# Patient Record
Sex: Male | Born: 1970 | Marital: Married | Smoking: Never smoker
Health system: Southern US, Community
[De-identification: ages and names within clinical notes are randomized; demographics above are authoritative.]

## PROBLEM LIST (undated history)

## (undated) DIAGNOSIS — K219 Gastro-esophageal reflux disease without esophagitis: Secondary | ICD-10-CM

## (undated) HISTORY — DX: Gastro-esophageal reflux disease without esophagitis: K21.9

## (undated) HISTORY — PX: CHOLECYSTECTOMY: SHX55

---

## 2018-01-16 DIAGNOSIS — K080 Exfoliation of teeth due to systemic causes: Secondary | ICD-10-CM | POA: Diagnosis not present

## 2018-01-30 DIAGNOSIS — H40033 Anatomical narrow angle, bilateral: Secondary | ICD-10-CM | POA: Diagnosis not present

## 2018-01-30 DIAGNOSIS — H04123 Dry eye syndrome of bilateral lacrimal glands: Secondary | ICD-10-CM | POA: Diagnosis not present

## 2018-02-06 DIAGNOSIS — H10013 Acute follicular conjunctivitis, bilateral: Secondary | ICD-10-CM | POA: Diagnosis not present

## 2018-06-22 DIAGNOSIS — J019 Acute sinusitis, unspecified: Secondary | ICD-10-CM | POA: Diagnosis not present

## 2018-06-22 DIAGNOSIS — H1033 Unspecified acute conjunctivitis, bilateral: Secondary | ICD-10-CM | POA: Diagnosis not present

## 2018-10-09 DIAGNOSIS — K080 Exfoliation of teeth due to systemic causes: Secondary | ICD-10-CM | POA: Diagnosis not present

## 2019-01-15 DIAGNOSIS — R062 Wheezing: Secondary | ICD-10-CM | POA: Diagnosis not present

## 2019-01-15 DIAGNOSIS — J209 Acute bronchitis, unspecified: Secondary | ICD-10-CM | POA: Diagnosis not present

## 2019-01-15 DIAGNOSIS — R05 Cough: Secondary | ICD-10-CM | POA: Diagnosis not present

## 2019-02-09 DIAGNOSIS — K080 Exfoliation of teeth due to systemic causes: Secondary | ICD-10-CM | POA: Diagnosis not present

## 2019-02-12 DIAGNOSIS — K080 Exfoliation of teeth due to systemic causes: Secondary | ICD-10-CM | POA: Diagnosis not present

## 2019-05-26 DIAGNOSIS — U071 COVID-19: Secondary | ICD-10-CM | POA: Diagnosis not present

## 2019-05-26 DIAGNOSIS — Z20828 Contact with and (suspected) exposure to other viral communicable diseases: Secondary | ICD-10-CM | POA: Diagnosis not present

## 2019-07-04 DIAGNOSIS — J3489 Other specified disorders of nose and nasal sinuses: Secondary | ICD-10-CM | POA: Diagnosis not present

## 2019-07-04 DIAGNOSIS — R0789 Other chest pain: Secondary | ICD-10-CM | POA: Diagnosis not present

## 2019-07-07 ENCOUNTER — Telehealth: Payer: Self-pay | Admitting: *Deleted

## 2019-07-07 NOTE — Telephone Encounter (Signed)
REFERRAL SENT TO SCHEDULING AND NOTES ON FILE FROM [facility]

## 2019-07-09 ENCOUNTER — Emergency Department (HOSPITAL_COMMUNITY): Payer: Federal, State, Local not specified - PPO

## 2019-07-09 ENCOUNTER — Emergency Department (HOSPITAL_COMMUNITY)
Admission: EM | Admit: 2019-07-09 | Discharge: 2019-07-09 | Disposition: A | Discharge status: Home / Self Care | Payer: Federal, State, Local not specified - PPO | Attending: Emergency Medicine | Admitting: Emergency Medicine

## 2019-07-09 ENCOUNTER — Other Ambulatory Visit: Payer: Self-pay

## 2019-07-09 DIAGNOSIS — R079 Chest pain, unspecified: Secondary | ICD-10-CM | POA: Insufficient documentation

## 2019-07-09 LAB — URINALYSIS, ROUTINE W REFLEX MICROSCOPIC
Bilirubin Urine: NEGATIVE
Glucose, UA: NEGATIVE mg/dL
Hgb urine dipstick: NEGATIVE
Ketones, ur: NEGATIVE mg/dL
Leukocytes,Ua: NEGATIVE
Nitrite: NEGATIVE
Protein, ur: NEGATIVE mg/dL
Specific Gravity, Urine: 1.026 (ref 1.005–1.030)
pH: 5 (ref 5.0–8.0)

## 2019-07-09 LAB — CBC
HCT: 51.4 % (ref 39.0–52.0)
Hemoglobin: 18 g/dL — ABNORMAL HIGH (ref 13.0–17.0)
MCH: 30.7 pg (ref 26.0–34.0)
MCHC: 35 g/dL (ref 30.0–36.0)
MCV: 87.6 fL (ref 80.0–100.0)
Platelets: 175 10*3/uL (ref 150–400)
RBC: 5.87 MIL/uL — ABNORMAL HIGH (ref 4.22–5.81)
RDW: 11.8 % (ref 11.5–15.5)
WBC: 7.3 10*3/uL (ref 4.0–10.5)
nRBC: 0 % (ref 0.0–0.2)

## 2019-07-09 LAB — BASIC METABOLIC PANEL
Anion gap: 11 (ref 5–15)
BUN: 14 mg/dL (ref 6–20)
CO2: 25 mmol/L (ref 22–32)
Calcium: 9.9 mg/dL (ref 8.9–10.3)
Chloride: 102 mmol/L (ref 98–111)
Creatinine, Ser: 0.96 mg/dL (ref 0.61–1.24)
GFR calc Af Amer: >60 mL/min (ref >60)
GFR calc non Af Amer: >60 mL/min (ref >60)
Glucose, Bld: 120 mg/dL — ABNORMAL HIGH (ref 70–99)
Potassium: 4 mmol/L (ref 3.5–5.1)
Sodium: 138 mmol/L (ref 135–145)

## 2019-07-09 LAB — TROPONIN I (HIGH SENSITIVITY)
Troponin I (High Sensitivity): <2 ng/L (ref <18)
Troponin I (High Sensitivity): <2 ng/L (ref <18)

## 2019-07-09 MED ORDER — SODIUM CHLORIDE 0.9% FLUSH
3.0000 mL | Freq: Once | INTRAVENOUS | Status: AC
  Administered 2019-07-09: 3 mL via INTRAVENOUS

## 2019-07-09 NOTE — ED Provider Notes (Signed)
[facility] EMERGENCY DEPARTMENT Provider Note   CSN: [id number] Arrival date & time: 07/09/19  1217    History   Chief Complaint Chief Complaint  Patient presents with   Chest Pain    HPI Anthony Thompson is a 48 y.o. male who presents to the ED today complaining of gradual onset, constant, achy, left sided chest pain x 2 weeks. Pt also complains of bilateral lower abdominal pain and flank pain x 2 weeks as well. He reports he was seen by Urgent Care last week and this week; they did a chest xray and an EKG both times and sent him home. Pt reports he decided to come to the ED today after being seen at urgent care this morning because he wants to know what is wrong. Pt reports he attempted to reach out to a cardiologist but they can only see him on 07/23. Pt has had chest pain like this in the past but states he has never been worked up for this. He did recently travel to [place] but reports his chest pain was present prior to his trip down. The chest pain is worsened with laying flat. Denies fever, chills, shortness of breath, nausea, vomiting, urinary symptoms, or any other associated symptoms.     No past medical history on file.  There are no active problems to display for this patient.       Home Medications    Prior to Admission medications   Not on File    Family History No family history on file.  Social History Social History   Tobacco Use   Smoking status: Not on file  Substance Use Topics   Alcohol use: Not on file   Drug use: Not on file     Allergies   Patient has no known allergies.   Review of Systems Review of Systems  Constitutional: Negative for chills and fever.  HENT: Negative for congestion.   Eyes: Negative for visual disturbance.  Respiratory: Negative for cough and shortness of breath.   Cardiovascular: Positive for chest pain. Negative for palpitations and leg swelling.  Gastrointestinal: Positive for abdominal  pain. Negative for constipation, diarrhea, rectal pain and vomiting.  Genitourinary: Negative for flank pain.  Musculoskeletal: Negative for myalgias.  Skin: Negative for rash.  Neurological: Negative for headaches.     Physical Exam Updated Vital Signs BP (!) 149/100 (BP Location: Left Arm)    Pulse 84    Temp 98.7 F (37.1 C) (Oral)    Resp 16    Ht 5\' 11"  (1.803 m)    Wt 104.3 kg    SpO2 100%    BMI 32.08 kg/m   Physical Exam Vitals signs and nursing note reviewed.  Constitutional:      Appearance: He is not ill-appearing.  HENT:     Head: Normocephalic and atraumatic.  Eyes:     Conjunctiva/sclera: Conjunctivae normal.  Neck:     Musculoskeletal: Neck supple.  Cardiovascular:     Rate and Rhythm: Normal rate and regular rhythm.     Pulses:          Radial pulses are 2+ on the right side and 2+ on the left side.       Dorsalis pedis pulses are 2+ on the right side and 2+ on the left side.     Heart sounds: Normal heart sounds.  Pulmonary:     Effort: Pulmonary effort is normal.     Breath sounds: Normal breath sounds.  No decreased breath sounds, wheezing, rhonchi or rales.  Chest:     Chest wall: No tenderness.  Abdominal:     Palpations: Abdomen is soft.     Tenderness: There is no abdominal tenderness. There is no guarding or rebound.     Comments: Soft, NTND, +BS throughout, no r/g/r, neg murphy's, neg mcburney's, no CVA TTP   Skin:    General: Skin is warm and dry.  Neurological:     Mental Status: He is alert.      ED Treatments / Results  Labs (all labs ordered are listed, but only abnormal results are displayed) Labs Reviewed  BASIC METABOLIC PANEL - Abnormal; Notable for the following components:      Result Value   Glucose, Bld 120 (*)    All other components within normal limits  CBC - Abnormal; Notable for the following components:   RBC 5.87 (*)    Hemoglobin 18.0 (*)    All other components within normal limits  URINALYSIS, ROUTINE W REFLEX  MICROSCOPIC  TROPONIN I (HIGH SENSITIVITY)  TROPONIN I (HIGH SENSITIVITY)    EKG EKG Interpretation  Date/Time:  Friday July 09 2019 12:13:03 EDT Ventricular Rate:  77 PR Interval:  150 QRS Duration: 82 QT Interval:  380 QTC Calculation: 430 R Axis:   75 Text Interpretation:  Normal sinus rhythm Cannot rule out Inferior infarct , age undetermined Abnormal ECG No old tracing to compare Confirmed by Daleen Bo [phone]) on 07/09/2019 12:41:47 PM   Radiology Dg Chest 2 View  Result Date: 07/09/2019 CLINICAL DATA:  Chest pain EXAM: CHEST - 2 VIEW COMPARISON:  None. FINDINGS: Lungs are clear. Heart size and pulmonary vascularity are normal. No adenopathy. No pneumothorax. No bone lesions. IMPRESSION: No edema or consolidation. Electronically Signed   By: Lowella Grip III M.D.   On: 07/09/2019 12:53    Procedures Procedures (including critical care time)  Medications Ordered in ED Medications  sodium chloride flush (NS) 0.9 % injection 3 mL (3 mLs Intravenous Given 07/09/19 1232)     Initial Impression / Assessment and Plan / ED Course  I have reviewed the triage vital signs and the nursing notes.  Pertinent labs & imaging results that were available during my care of the patient were reviewed by me and considered in my medical decision making (see chart for details).    48 year old male presenting with chest pain, bilateral lower abd pain, and flank pain x 2 weeks. Pt has been seen by UC twice in the past week without any findings of CXR or EKG. He came here today to get answers. No other concerning symptoms regarding chest pain including SOB, radiation of pain, nausea, vomiting. The pain has been present consistently for the past 2 weeks. No hx of MI in the past and no FHx CAD. Pt is nonsmoker. He did recently travel to [place] but is adamant that his pain started prior to his trip down; no other recent prolonged travel or immobilization prior to the pain beginning. No recent  URI like symptoms to suggest pericarditis vs myocarditis. Very low suspicion for ACS today given length of symptoms. Regarding abdominal pain - pt is non tender to CVAs bilaterally as well as abdomen on exam. No nausea, vomiting, urinary sx. Low suspicion for kidney stones today but will obtain U/A; do not feel patient needs imaging at this time to rule out kidney stones vs acute abdomen as he has no tenderness. Pt afebrile in the ED without  tachycardia or tachypnea; does not meet sirs criteria. Labwork obtained prior to being seen; EKG with age indeterminate inferior infarct. CXR negative. Initial trop negative; will obtain repeat trop given EKG findings although do not suspect elevation at this time. Remainder of bloodwork reassuring. Will reevaluate once U/A and repeat trop returns.   U/A clear; no signs of infection or blood to suggest kidney stones. Repeat trop negative; heart score 2. Will discharge patient home at this time with close PCP follow up; he reports that he has a telemedicine appt with cardiology on 07/23; advised to keep appointment/call them to see if they can see him sooner given he was in the ED. He is in agreement with plan at this time and stable for discharge home.   HEAR Score: 2    Final Clinical Impressions(s) / ED Diagnoses   Final diagnoses:  Nonspecific chest pain    ED Discharge Orders    None       Eustaquio Maize, Hershal Coria 07/09/19 2137    Daleen Bo, MD 07/12/19 1428

## 2019-07-09 NOTE — Discharge Instructions (Signed)
You were seen in the ED today for chest pain; your labwork and chest xray were reassuring today. Please follow up with your PCP. Please keep appointment with cardiology scheduled for later this month. Return to the ED for any worsening chest pain, shortness of breath, vomiting.

## 2019-07-09 NOTE — ED Triage Notes (Signed)
To ED for eval of 2 wks of left side cp. States he feels a bubble around his heart. Also complains of bilateral lower abd pain and flank pain. Denies urinary symptoms. Pain and discomfort worsens when lays down. Seen at a [facility] prior to ED. No meds and no hx

## 2019-07-21 ENCOUNTER — Telehealth: Payer: Self-pay | Admitting: Cardiology

## 2019-07-21 NOTE — Progress Notes (Deleted)
Cardiology Office Note   Date:  07/21/2019   ID:  [name], DOB 23-Apr-1971, MRN [id number]  PCP:  Patient, No Pcp Per  Cardiologist:   No primary care provider on file. Referring:  ***   History of Present Illness: Anthony Thompson is a 48 y.o. male who is referred by *** for evaluation of chest pain.   The patient was n the ED for this earlier this month.  I reviewed these records for this visit.    He had negative hs Trop x 2 and a HEAR score of 2.  ***      No past medical history on file.  *** The histories are not reviewed yet. Please review them in the "History" navigator section and refresh this [place].   No current outpatient medications on file.   No current facility-administered medications for this visit.     Allergies:   Patient has no known allergies.    Social History:  The patient     Family History:  The patient's ***family history is not on file.    ROS:  Please see the history of present illness.   Otherwise, review of systems are positive for {NONE DEFAULTED:18576::"none"}.   All other systems are reviewed and negative.    PHYSICAL EXAM: VS:  There were no vitals taken for this visit. , BMI There is no height or weight on file to calculate BMI. GENERAL:  Well appearing HEENT:  Pupils equal round and reactive, fundi not visualized, oral mucosa unremarkable NECK:  No jugular venous distention, waveform within normal limits, carotid upstroke brisk and symmetric, no bruits, no thyromegaly LYMPHATICS:  No cervical, inguinal adenopathy LUNGS:  Clear to auscultation bilaterally BACK:  No CVA tenderness CHEST:  Unremarkable HEART:  PMI not displaced or sustained,S1 and S2 within normal limits, no S3, no S4, no clicks, no rubs, *** murmurs ABD:  Flat, positive bowel sounds normal in frequency in pitch, no bruits, no rebound, no guarding, no midline pulsatile mass, no hepatomegaly, no splenomegaly EXT:  2 plus pulses throughout, no edema, no  cyanosis no clubbing SKIN:  No rashes no nodules NEURO:  Cranial nerves II through XII grossly intact, motor grossly intact throughout PSYCH:  Cognitively intact, oriented to person place and time    EKG:  EKG {ACTION; IS/IS HYQ:[id number]} ordered today. The ekg ordered 07/09/19 demonstrates NSR rate 71, axis WNL, intervals WNL, no acute ST wave changes.  Nonspecific inferior T waves.     Recent Labs: 07/09/2019: BUN 14; Creatinine, Ser 0.96; Hemoglobin 18.0; Platelets 175; Potassium 4.0; Sodium 138    Lipid Panel No results found for: CHOL, TRIG, HDL, CHOLHDL, VLDL, LDLCALC, LDLDIRECT    Wt Readings from Last 3 Encounters:  07/09/19 230 lb (104.3 kg)      Other studies Reviewed: Additional studies/ records that were reviewed today include: ***. Review of the above records demonstrates:  Please see elsewhere in the note.  ***   ASSESSMENT AND PLAN:  CHEST PAIN:  ***   Current medicines are reviewed at length with the patient today.  The patient {ACTIONS; HAS/DOES NOT HAVE:19233} concerns regarding medicines.  The following changes have been made:  {PLAN; NO CHANGE:13088:s}  Labs/ tests ordered today include: *** No orders of the defined types were placed in this encounter.    Disposition:   FU with ***    Signed, Minus Breeding, MD  07/21/2019 8:00 AM    [facility]

## 2019-07-21 NOTE — Telephone Encounter (Signed)
IUnable to contact patient, phone was busy.

## 2019-07-22 ENCOUNTER — Ambulatory Visit: Payer: Self-pay | Admitting: Cardiology

## 2019-07-22 NOTE — Telephone Encounter (Signed)
Called pt(pt did not show up for his appt scheduled this morning @940am ) per Dr Percival Spanish direction, he states that we had the wrong number so he had the operator that answered the phone to reschedule him change his number. He was telling me that most mornings he is waking with chest pain/pressure. He states that he has noticed that this is on the days that he DOES NOT exercise. He was wondering if he needs to be seen sooner than his scheduled appt date 08-12-2019. D/w Dr Percival Spanish offered him to come in at the end of the day to be evaluated and he states that he cannot make it to the office today to be evaluated. Per Dr Percival Spanish schedule ASAP with APP. Pt states that he cannot come in until after 08-02-2019. I have scheduled him to see Jory Sims, DNP on 08-02-2019.

## 2019-07-22 NOTE — Telephone Encounter (Signed)
CONTINUED(got kicked out of chart): Pt will go to ER if this happens again, pt does not have an rx for nitro. Verbalizes understanding.

## 2019-07-22 NOTE — Telephone Encounter (Signed)
PT NO-SHOWED FOR HIS APPT TODAY AT 940 TRIED X5

## 2019-08-01 NOTE — Progress Notes (Signed)
Cardiology Office Note   Date:  08/02/2019   ID:  Anthony Thompson, DOB [birth year], MRN [id number]  PCP:  Patient, No Pcp Per  Cardiologist:  NEW  New Patient to be established with Dr.Christopher    History of Present Illness: Anthony Thompson is a 48 y.o. male who presents for evaluation of recurrent chest pain, as a new patient.  He had been seen in the emergency room on 07/09/2019 with complaints of chest pain bilateral lower abdominal pain and flank pain for 2 weeks.  He has not been seen by cardiology but was planned to see Dr. Percival Spanish on 07/22/2019 but was unable to come to the appointment.  During ER evaluation on 07/09/2019, EKG revealed normal sinus rhythm question old inferior infarct age undetermined heart rate was 77 bpm.  Per ER note he has no family history of coronary artery disease, he was a non-smoker.  He was not found to have upper respiratory infection, or symptoms to suggest pericarditis or myocarditis.  He was ruled out for ACS.  He was to follow-up with cardiology.   He comes today to be established in our practice with 6 months of chest discomfort, described as sharp, heaviness, radiating down the left arm, sometimes awaking him from sleep. He states that he is tired of the pain.   History reviewed. No pertinent past medical history.   Current Outpatient Medications  Medication Sig Dispense Refill  . pantoprazole (PROTONIX) 40 MG tablet Take 1 tablet (40 mg total) by mouth daily. 30 tablet 6   No current facility-administered medications for this visit.     Allergies:   Patient has no known allergies.    Social History:  The patient  reports that he has never smoked. He has never used smokeless tobacco. He reports current alcohol use.   Family History:  The patient's family history includes Dementia in his mother; Hyperlipidemia in his father.    ROS: All other systems are reviewed and negative. Unless otherwise mentioned in H&P    PHYSICAL EXAM: VS:  BP 118/70    Pulse 66   Ht '5\' 11"'  (1.803 m)   Wt 232 lb (105.2 kg)   BMI 32.36 kg/m  , BMI Body mass index is 32.36 kg/m. GEN: Well nourished, well developed, in no acute distress HEENT: normal Neck: no JVD, carotid bruits, or masses Cardiac: RRR; no murmurs, rubs, or gallops,no edema  Respiratory:  Clear to auscultation bilaterally, normal work of breathing GI: soft, nontender, nondistended, + BS MS: no deformity or atrophy Skin: warm and dry, no rash Neuro:  Strength and sensation are intact Psych: euthymic mood, full affect   EKG: Normal sinus rhythm, rate of 66 bpm, no abnormalities noted on EKG.  Recent Labs: 07/09/2019: BUN 14; Creatinine, Ser 0.96; Hemoglobin 18.0; Platelets 175; Potassium 4.0; Sodium 138    Lipid Panel No results found for: CHOL, TRIG, HDL, CHOLHDL, VLDL, LDLCALC, LDLDIRECT    Wt Readings from Last 3 Encounters:  08/02/19 232 lb (105.2 kg)  07/09/19 230 lb (104.3 kg)      Other studies Reviewed: None.  ASSESSMENT AND PLAN:  1.  Chest discomfort: Uncertain etiology at this time.  The patient does not have significant cardiovascular risk factors but is having some chest pressure, worse when lying down often awakening him at night, and some sharp discomfort in the middle of his chest.  The pain is not reproducible on palpation.  The patient is able to do ADLs, mow his lawn without  pressure, but when he rests or lies down he has symptoms.  Chest pain appears atypical at this time.  However, in order to be thorough we will plan a nuclear medicine stress test in which she will also walk on the treadmill.  This is been reviewed by Dr. Harrell Gave who has met the patient and would like to have him walk on the treadmill to evaluate for his heart rate blood pressure and chronotropic incompetence as well as evaluation for ischemia. Checking BMET and Fasting lipids and LFT's   2.  R/O GERD: I have started him on Protonix 40 mg daily.   He is aware that this may take a  couple of days to take effect if his symptoms are related to GERD.   Current medicines are reviewed at length with the patient today.    Labs/ tests ordered today include: NM Stress test , fasting lipids and LFTs for risk stratification.   Phill Myron. West Pugh, ANP, AACC   08/02/2019 5:07 PM    [place] [address] Office [phone] Fax [phone]

## 2019-08-02 ENCOUNTER — Encounter: Payer: Self-pay | Admitting: Adult Health

## 2019-08-02 ENCOUNTER — Ambulatory Visit (INDEPENDENT_AMBULATORY_CARE_PROVIDER_SITE_OTHER): Payer: Federal, State, Local not specified - PPO | Admitting: Adult Health

## 2019-08-02 ENCOUNTER — Other Ambulatory Visit: Payer: Self-pay

## 2019-08-02 VITALS — BP 118/70 | HR 66 | Ht 71.0 in | Wt 232.0 lb

## 2019-08-02 DIAGNOSIS — Z79899 Other long term (current) drug therapy: Secondary | ICD-10-CM | POA: Diagnosis not present

## 2019-08-02 DIAGNOSIS — Z13220 Encounter for screening for lipoid disorders: Secondary | ICD-10-CM | POA: Diagnosis not present

## 2019-08-02 DIAGNOSIS — R0789 Other chest pain: Secondary | ICD-10-CM | POA: Diagnosis not present

## 2019-08-02 MED ORDER — PANTOPRAZOLE SODIUM 40 MG PO TBEC: 40.0000 mg | DELAYED_RELEASE_TABLET | Freq: Every day | ORAL | 6 refills | Status: DC

## 2019-08-02 NOTE — Patient Instructions (Addendum)
Medication Instructions:  START- Protonix 40 mg by mouth daily  If you need a refill on your cardiac medications before your next appointment, please call your pharmacy.  Labwork: Fasting Lipid, Liver and BMP HERE IN OUR OFFICE AT [facility]  You will need to fast. DO NOT EAT OR DRINK PAST MIDNIGHT.    Take the provided lab slips with you to the lab for your blood draw.   When you have your labs (blood work) drawn today and your tests are completely normal, you will receive your results only by MyChart Message (if you have MyChart) -OR-  A paper copy in the mail.  If you have any lab test that is abnormal or we need to change your treatment, we will call you to review these results.  Testing/Procedures: Your physician has requested that you have an exercise tolerance test. For further information please visit [email or web address]. Please also follow instruction sheet, as given.  Follow-Up: . Your physician recommends that you schedule a follow-up appointment in: 3 Weeks with Jory Sims   At [facility], you and your health needs are our priority.  As part of our continuing mission to provide you with exceptional heart care, we have created designated Provider Care Teams.  These Care Teams include your primary Cardiologist (physician) and Advanced Practice Providers (APPs -  Physician Assistants and Nurse Practitioners) who all work together to provide you with the care you need, when you need it.  Thank you for choosing [facility] at [facility]!!    Patient Instructions This is a Drive Up Visit at the [employer] [address]. Someone will direct you to the appropriate testing line. Stay in your car and someone will be with you shortly.

## 2019-08-05 NOTE — Addendum Note (Signed)
Addended by: Crissie Reese on: 08/05/2019 04:46 PM   Modules accepted: Orders

## 2019-08-09 DIAGNOSIS — R079 Chest pain, unspecified: Secondary | ICD-10-CM | POA: Diagnosis not present

## 2019-08-09 DIAGNOSIS — Z0000 Encounter for general adult medical examination without abnormal findings: Secondary | ICD-10-CM | POA: Diagnosis not present

## 2019-08-09 DIAGNOSIS — Z13220 Encounter for screening for lipoid disorders: Secondary | ICD-10-CM | POA: Diagnosis not present

## 2019-08-09 DIAGNOSIS — R7309 Other abnormal glucose: Secondary | ICD-10-CM | POA: Diagnosis not present

## 2019-08-09 DIAGNOSIS — D582 Other hemoglobinopathies: Secondary | ICD-10-CM | POA: Diagnosis not present

## 2019-08-10 ENCOUNTER — Other Ambulatory Visit (HOSPITAL_COMMUNITY)
Admission: RE | Admit: 2019-08-10 | Discharge: 2019-08-10 | Disposition: A | Discharge status: Home / Self Care | Payer: Federal, State, Local not specified - PPO | Source: Ambulatory Visit | Attending: Adult Health | Admitting: Adult Health

## 2019-08-10 ENCOUNTER — Telehealth (HOSPITAL_COMMUNITY): Payer: Self-pay

## 2019-08-10 DIAGNOSIS — Z20828 Contact with and (suspected) exposure to other viral communicable diseases: Secondary | ICD-10-CM | POA: Diagnosis not present

## 2019-08-10 DIAGNOSIS — Z01812 Encounter for preprocedural laboratory examination: Secondary | ICD-10-CM | POA: Insufficient documentation

## 2019-08-10 LAB — SARS CORONAVIRUS 2 (TAT 6-24 HRS): SARS Coronavirus 2: NEGATIVE

## 2019-08-10 NOTE — Telephone Encounter (Signed)
Encounter complete. 

## 2019-08-12 ENCOUNTER — Telehealth: Payer: Self-pay | Admitting: Cardiology

## 2019-08-13 ENCOUNTER — Ambulatory Visit (HOSPITAL_COMMUNITY)
Admission: RE | Admit: 2019-08-13 | Discharge: 2019-08-13 | Disposition: A | Discharge status: Home / Self Care | Payer: Federal, State, Local not specified - PPO | Source: Ambulatory Visit | Attending: Cardiology | Admitting: Cardiology

## 2019-08-13 ENCOUNTER — Other Ambulatory Visit: Payer: Self-pay

## 2019-08-13 DIAGNOSIS — R0789 Other chest pain: Secondary | ICD-10-CM

## 2019-08-13 DIAGNOSIS — Z79899 Other long term (current) drug therapy: Secondary | ICD-10-CM | POA: Diagnosis not present

## 2019-08-13 DIAGNOSIS — Z13220 Encounter for screening for lipoid disorders: Secondary | ICD-10-CM | POA: Diagnosis not present

## 2019-08-13 LAB — EXERCISE TOLERANCE TEST
Estimated workload: 16.3 METS
Exercise duration (min): 13 min
Exercise duration (sec): 34 s
MPHR: 174 {beats}/min
Peak HR: 169 {beats}/min
Percent HR: 97 %
Rest HR: 64 {beats}/min

## 2019-08-13 LAB — BASIC METABOLIC PANEL
BUN/Creatinine Ratio: 18 (ref 9–20)
BUN: 16 mg/dL (ref 6–24)
CO2: 24 mmol/L (ref 20–29)
Calcium: 9.6 mg/dL (ref 8.7–10.2)
Chloride: 100 mmol/L (ref 96–106)
Creatinine, Ser: 0.88 mg/dL (ref 0.76–1.27)
GFR calc Af Amer: 119 mL/min/{1.73_m2} (ref >59)
GFR calc non Af Amer: 103 mL/min/{1.73_m2} (ref >59)
Glucose: 98 mg/dL (ref 65–99)
Potassium: 4.2 mmol/L (ref 3.5–5.2)
Sodium: 139 mmol/L (ref 134–144)

## 2019-08-13 LAB — HEPATIC FUNCTION PANEL
ALT: 40 IU/L (ref 0–44)
AST: 22 IU/L (ref 0–40)
Albumin: 4.7 g/dL (ref 4.0–5.0)
Alkaline Phosphatase: 76 IU/L (ref 39–117)
Bilirubin Total: 0.9 mg/dL (ref 0.0–1.2)
Bilirubin, Direct: 0.22 mg/dL (ref 0.00–0.40)
Total Protein: 6.7 g/dL (ref 6.0–8.5)

## 2019-08-13 LAB — LIPID PANEL
Chol/HDL Ratio: 3.8 ratio (ref 0.0–5.0)
Cholesterol, Total: 183 mg/dL (ref 100–199)
HDL: 48 mg/dL (ref >39)
LDL Calculated: 117 mg/dL — ABNORMAL HIGH (ref 0–99)
Triglycerides: 91 mg/dL (ref 0–149)
VLDL Cholesterol Cal: 18 mg/dL (ref 5–40)

## 2019-08-28 NOTE — Progress Notes (Signed)
Virtual Visit via Video Note   This visit type was conducted due to national recommendations for restrictions regarding the COVID-19 Pandemic (e.g. social distancing) in an effort to limit this patient's exposure and mitigate transmission in our community.  Due to his co-morbid illnesses, this patient is at least at moderate risk for complications without adequate follow up.  This format is felt to be most appropriate for this patient at this time.  All issues noted in this document were discussed and addressed.  A limited physical exam was performed with this format.  Please refer to the patient's chart for his consent to telehealth for [facility].   Date:  08/30/2019   ID:  Anthony Thompson, DOB January 17, 1971, MRN [id number]  Patient Location: Home Provider Location: Home  PCP:  Patient, No Pcp Per  Cardiologist: Dr. Harrell Gave Electrophysiologist:  None   Evaluation Performed:  Follow-Up Visit  Chief Complaint: Chest pain  History of Present Illness:    Anthony Thompson is a 48 y.o. male with we are following for ongoing assessment and management of chest pain.  He had been seen in the emergency room with complaints of chest pain and was to follow-up with cardiology.  He was established with our office on 08/02/2019 as a new patient.  At that time he had complaints of chest discomfort on and off for 6 months described as sharp heaviness radiating down his left arm sometimes awakening him from sleep.  Due to significant cardiovascular risk factors, he was scheduled for a stress test for evaluation of ischemia.  Also a BMET and fasting lipids and LFTs were ordered.  The patient had been seen onsite by Dr. Harrell Gave who agreed with this plan.  Stress test completed on 08/13/2019 revealed normal sinus rhythm, no evidence of ischemia on EKG, blood pressure response was normal.  Review of labs found that LDL was 117, total cholesterol 183 glycerides 91 HDL 48.  Chemistries were normal as well  as hepatic function.   Anthony Thompson continues to have discomfort in his chest located on the left side worrisome to him more so at night when he lays down.  He is frustrated by it and does not understand why it continues to occur even though he has normal stress test  The patient does not have symptoms concerning for COVID-19 infection (fever, chills, cough, or new shortness of breath).    Past Medical History:  Diagnosis Date   GERD (gastroesophageal reflux disease)       Current Meds  Medication Sig   pantoprazole (PROTONIX) 40 MG tablet Take 1 tablet (40 mg total) by mouth daily.     Allergies:   Patient has no known allergies.   Social History   Tobacco Use   Smoking status: Never Smoker   Smokeless tobacco: Never Used  Substance Use Topics   Alcohol use: Yes    Comment: occassionally   Drug use: Not on file     Family Hx: The patient's family history includes Dementia in his mother; Hyperlipidemia in his father.  ROS:   Please see the history of present illness.    All other systems reviewed and are negative.   Prior CV studies:   The following studies were reviewed today:  EXERCISE STRESS TEST 08/13/2019 Normal exercise stress test at excellent work load Normal hemodynamic response No significant arrhythmias  Labs/Other Tests and Data Reviewed:    EKG:  No ECG reviewed.  Recent Labs: 07/09/2019: Hemoglobin 18.0; Platelets 175 08/13/2019:  ALT 40; BUN 16; Creatinine, Ser 0.88; Potassium 4.2; Sodium 139   Recent Lipid Panel Lab Results  Component Value Date/Time   CHOL 183 08/13/2019 09:10 AM   TRIG 91 08/13/2019 09:10 AM   HDL 48 08/13/2019 09:10 AM   CHOLHDL 3.8 08/13/2019 09:10 AM   LDLCALC 117 (H) 08/13/2019 09:10 AM    Wt Readings from Last 3 Encounters:  08/30/19 230 lb (104.3 kg)  08/02/19 232 lb (105.2 kg)  07/09/19 230 lb (104.3 kg)     Objective:    Vital Signs:  Ht 5\' 11"  (1.803 m)    Wt 230 lb (104.3 kg)    BMI 32.08 kg/m     VITAL SIGNS:  reviewed GEN:  no acute distress RESPIRATORY:  normal respiratory effort, symmetric expansion NEURO:  alert and oriented x 3, no obvious focal deficit PSYCH:  normal affect  ASSESSMENT & PLAN:    1.  Recurrent chest pain: Some relief from omeprazole.  Stress test was negative for ischemia.  He does not have any cardiovascular risk factors other than obesity.  I have offered a cardiac CTA for more definitive evaluation of coronary artery disease.  He wishes to hold off on this testing for now.  2.  GERD: Currently on omeprazole 40 mg daily.  I told him that he may take an additional 40 mg at nighttime until he sees his PCP who may need to refer him to GI.  He has found some relief with the omeprazole during the a.m. but at nighttime he noticed his discomfort coming back.  We will see him after he has his GI referral for any further cardiac testing which may be necessary to include the cardiac CTA.  He verbalizes understanding and will be in touch with [place].  Plan follow-up in 2 to 3 months.  COVID-19 Education: The signs and symptoms of COVID-19 were discussed with the patient and how to seek care for testing (follow up with PCP or arrange E-visit).  The importance of social distancing was discussed today.  Time:   Today, I have spent 10 minutes with the patient with telehealth technology discussing the above problems.     Medication Adjustments/Labs and Tests Ordered: Current medicines are reviewed at length with the patient today.  Concerns regarding medicines are outlined above.   Tests Ordered: No orders of the defined types were placed in this encounter.   Medication Changes: No orders of the defined types were placed in this encounter.   Disposition:  Follow up 2 to 3 months  Signed, Phill Myron. West Pugh, ANP, AACC  08/30/2019 1:20 PM    [facility]

## 2019-08-30 ENCOUNTER — Telehealth (INDEPENDENT_AMBULATORY_CARE_PROVIDER_SITE_OTHER): Payer: Federal, State, Local not specified - PPO | Admitting: Adult Health

## 2019-08-30 ENCOUNTER — Encounter: Payer: Self-pay | Admitting: Adult Health

## 2019-08-30 VITALS — Ht 71.0 in | Wt 230.0 lb

## 2019-08-30 DIAGNOSIS — K219 Gastro-esophageal reflux disease without esophagitis: Secondary | ICD-10-CM

## 2019-08-30 DIAGNOSIS — R0789 Other chest pain: Secondary | ICD-10-CM

## 2019-08-30 NOTE — Patient Instructions (Signed)
Medication Instructions:  Continue current medications  If you need a refill on your cardiac medications before your next appointment, please call your pharmacy.  Labwork: None Ordered   Testing/Procedures: None Ordered  Follow-Up: You will need a follow up appointment in 3 months.  Please call our office 2 months in advance to schedule this appointment.  You may see Dr Harrell Gave or one of the following Advanced Practice Providers on your designated Care Team:   Rosaria Ferries, PA-C . Jory Sims, DNP, ANP     At [facility], you and your health needs are our priority.  As part of our continuing mission to provide you with exceptional heart care, we have created designated Provider Care Teams.  These Care Teams include your primary Cardiologist (physician) and Advanced Practice Providers (APPs -  Physician Assistants and Nurse Practitioners) who all work together to provide you with the care you need, when you need it.  Thank you for choosing [facility] at [facility]!!

## 2019-09-01 DIAGNOSIS — Z23 Encounter for immunization: Secondary | ICD-10-CM | POA: Diagnosis not present

## 2019-09-01 DIAGNOSIS — R079 Chest pain, unspecified: Secondary | ICD-10-CM | POA: Diagnosis not present

## 2019-09-02 DIAGNOSIS — R1012 Left upper quadrant pain: Secondary | ICD-10-CM | POA: Diagnosis not present

## 2019-09-02 DIAGNOSIS — R0789 Other chest pain: Secondary | ICD-10-CM | POA: Diagnosis not present

## 2019-09-09 ENCOUNTER — Other Ambulatory Visit: Payer: Self-pay | Admitting: Gastroenterology

## 2019-09-09 DIAGNOSIS — R1012 Left upper quadrant pain: Secondary | ICD-10-CM

## 2019-09-13 DIAGNOSIS — R1012 Left upper quadrant pain: Secondary | ICD-10-CM | POA: Diagnosis not present

## 2019-09-16 DIAGNOSIS — K080 Exfoliation of teeth due to systemic causes: Secondary | ICD-10-CM | POA: Diagnosis not present

## 2019-09-24 ENCOUNTER — Other Ambulatory Visit: Payer: Federal, State, Local not specified - PPO

## 2019-09-28 ENCOUNTER — Other Ambulatory Visit: Payer: Federal, State, Local not specified - PPO

## 2019-10-04 ENCOUNTER — Ambulatory Visit
Admission: RE | Admit: 2019-10-04 | Discharge: 2019-10-04 | Disposition: A | Discharge status: Home / Self Care | Payer: Federal, State, Local not specified - PPO | Source: Ambulatory Visit | Attending: Gastroenterology | Admitting: Gastroenterology

## 2019-10-04 DIAGNOSIS — R1012 Left upper quadrant pain: Secondary | ICD-10-CM

## 2019-10-04 DIAGNOSIS — R1010 Upper abdominal pain, unspecified: Secondary | ICD-10-CM | POA: Diagnosis not present

## 2019-10-04 MED ORDER — IOPAMIDOL (ISOVUE-300) INJECTION 61%
100.0000 mL | Freq: Once | INTRAVENOUS | Status: AC | PRN
  Administered 2019-10-04: 13:00:00 100 mL via INTRAVENOUS

## 2019-10-19 ENCOUNTER — Other Ambulatory Visit: Payer: Self-pay

## 2019-10-19 MED ORDER — PANTOPRAZOLE SODIUM 40 MG PO TBEC: 40.0000 mg | DELAYED_RELEASE_TABLET | Freq: Every day | ORAL | 6 refills | Status: DC

## 2019-10-21 DIAGNOSIS — R0789 Other chest pain: Secondary | ICD-10-CM | POA: Diagnosis not present

## 2019-10-21 DIAGNOSIS — R079 Chest pain, unspecified: Secondary | ICD-10-CM | POA: Diagnosis not present

## 2019-10-22 ENCOUNTER — Other Ambulatory Visit: Payer: Self-pay

## 2019-10-22 MED ORDER — PANTOPRAZOLE SODIUM 40 MG PO TBEC: 40.0000 mg | DELAYED_RELEASE_TABLET | Freq: Every day | ORAL | 6 refills | Status: DC

## 2019-10-28 DIAGNOSIS — Z1159 Encounter for screening for other viral diseases: Secondary | ICD-10-CM | POA: Diagnosis not present

## 2019-11-02 DIAGNOSIS — K2930 Chronic superficial gastritis without bleeding: Secondary | ICD-10-CM | POA: Diagnosis not present

## 2019-11-02 DIAGNOSIS — K2951 Unspecified chronic gastritis with bleeding: Secondary | ICD-10-CM | POA: Diagnosis not present

## 2019-11-02 DIAGNOSIS — K228 Other specified diseases of esophagus: Secondary | ICD-10-CM | POA: Diagnosis not present

## 2019-11-02 DIAGNOSIS — R0789 Other chest pain: Secondary | ICD-10-CM | POA: Diagnosis not present

## 2019-11-04 IMAGING — DX CHEST - 2 VIEW
2 series · 2 of 2 positions shown · non-contrast
Comparison: None.

CLINICAL DATA: Chest pain

EXAM:
CHEST - 2 VIEW

[chest pa]
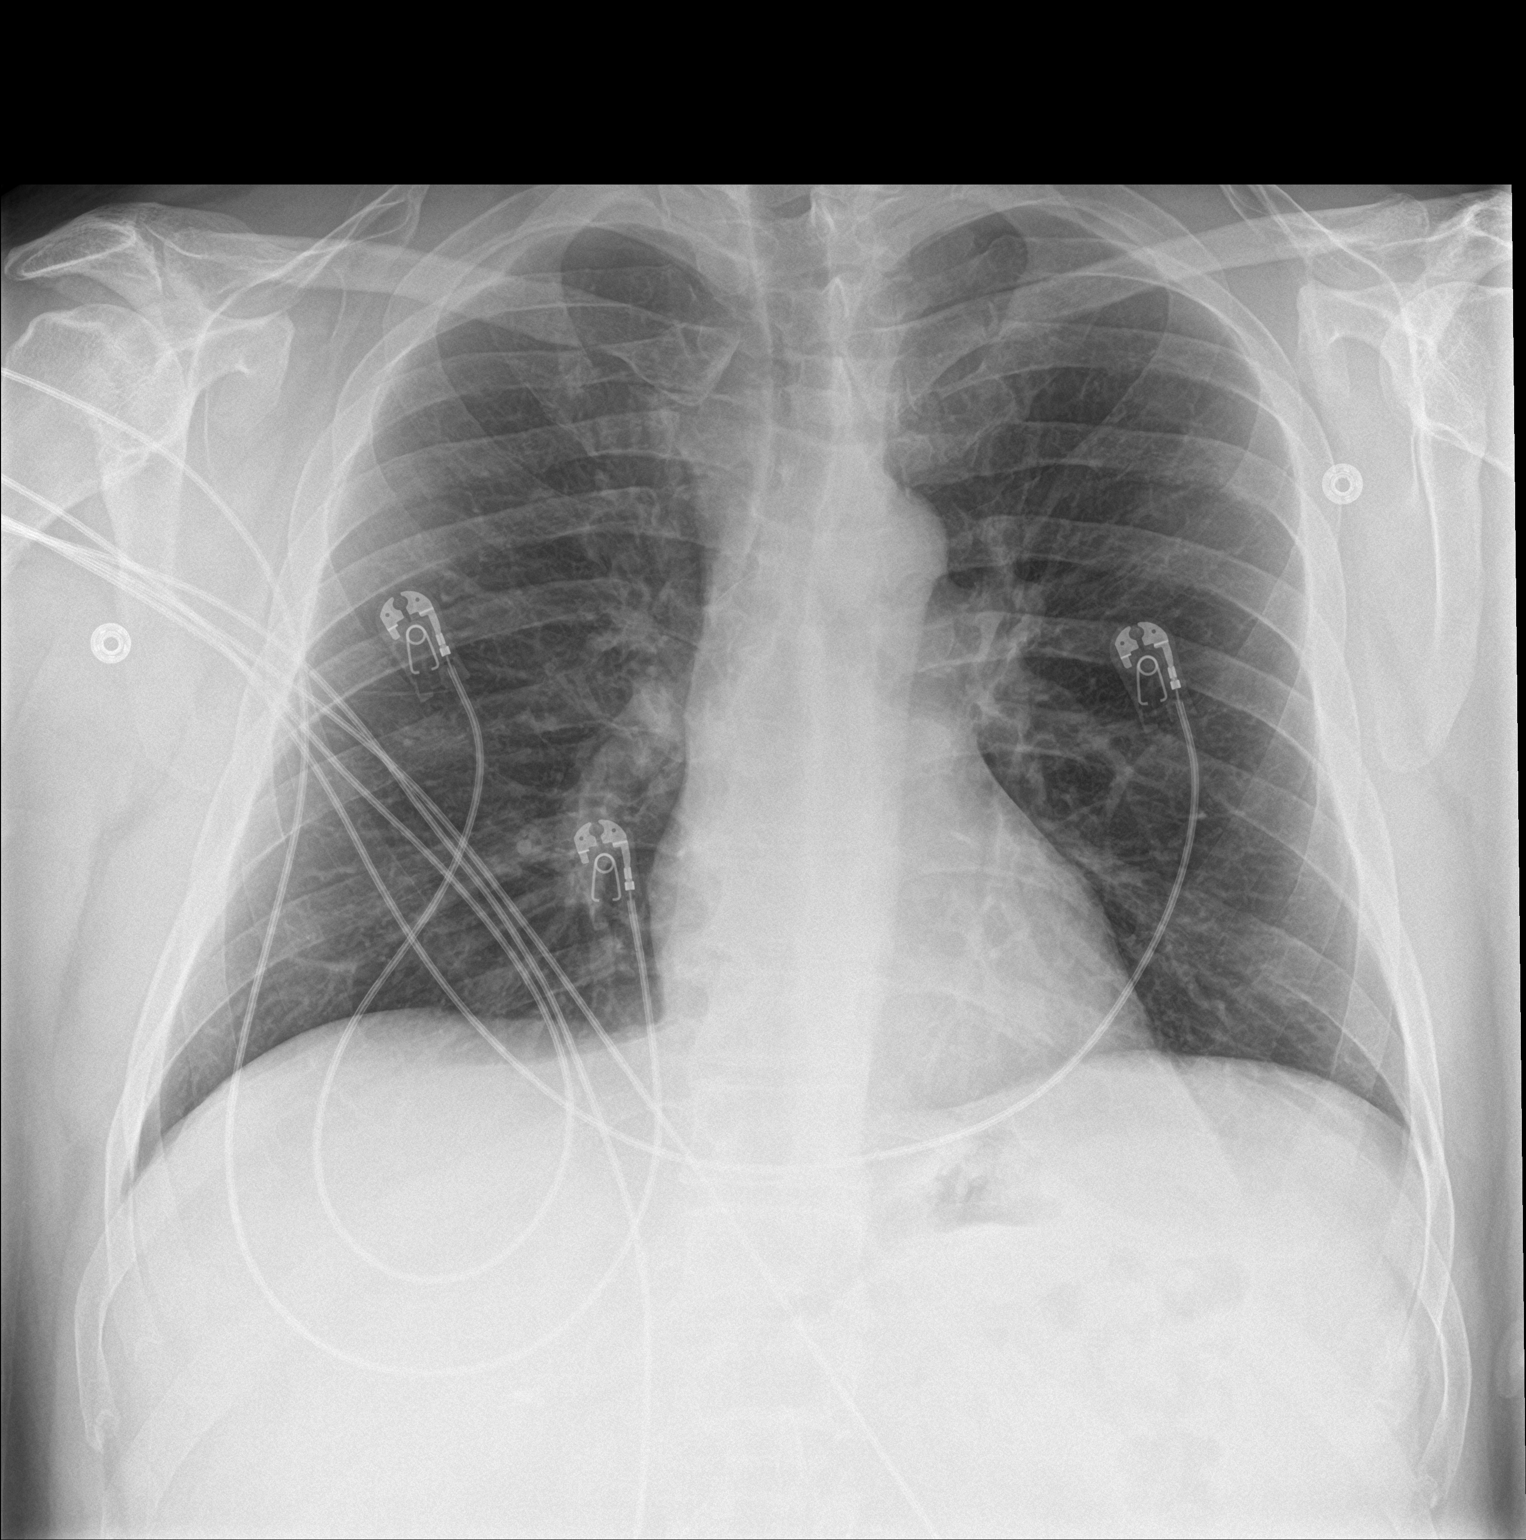

[chest lat]
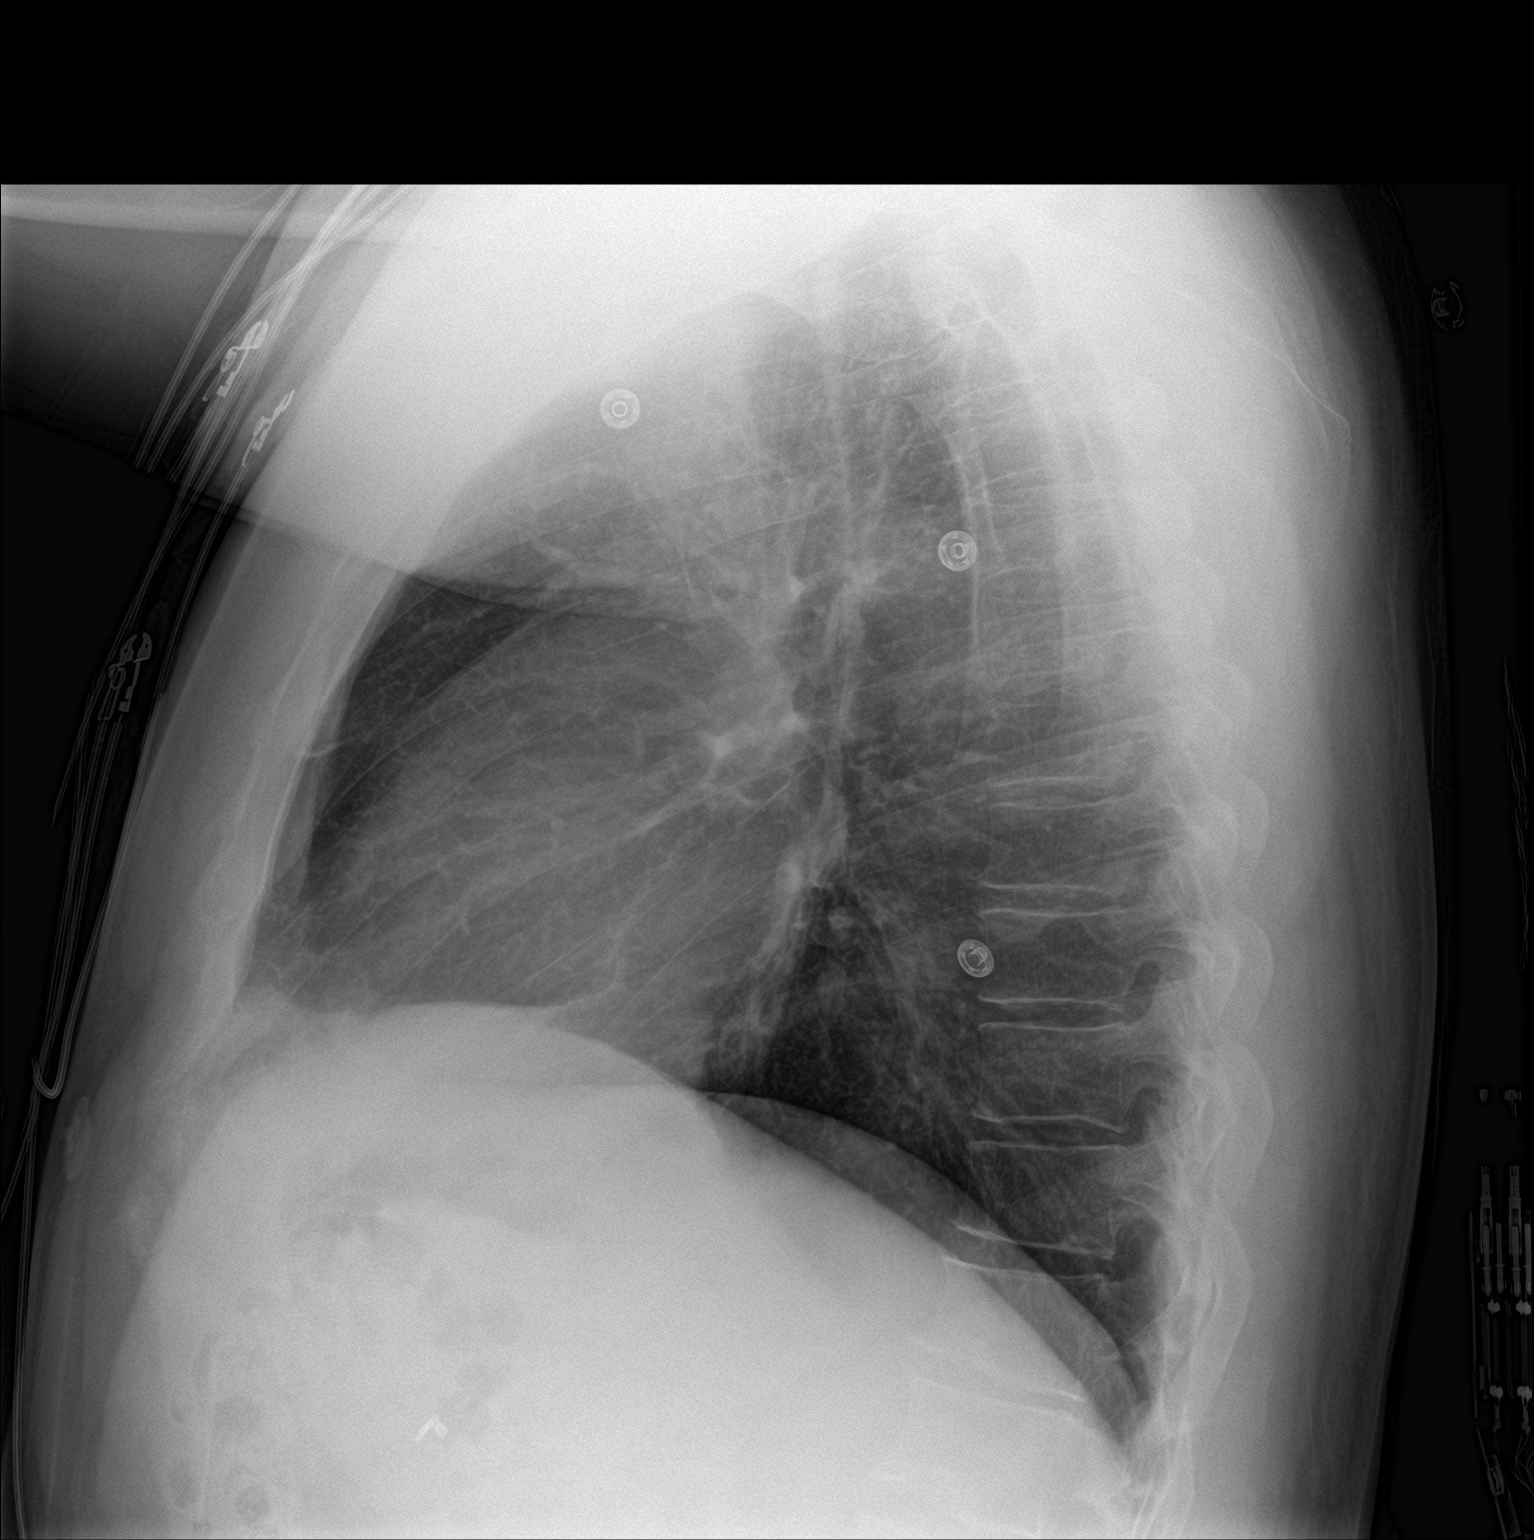

[2 of 2 positions shown; findings below may reference images not displayed]

FINDINGS: Lungs are clear. Heart size and pulmonary vascularity are normal. No
adenopathy. No pneumothorax. No bone lesions.
IMPRESSION: No edema or consolidation.

## 2019-11-29 DIAGNOSIS — Z20828 Contact with and (suspected) exposure to other viral communicable diseases: Secondary | ICD-10-CM | POA: Diagnosis not present

## 2019-12-17 ENCOUNTER — Encounter: Payer: Self-pay | Admitting: Physician Assistant

## 2019-12-17 ENCOUNTER — Telehealth (INDEPENDENT_AMBULATORY_CARE_PROVIDER_SITE_OTHER): Payer: Federal, State, Local not specified - PPO | Admitting: Physician Assistant

## 2019-12-17 VITALS — Ht 71.0 in | Wt 225.0 lb

## 2019-12-17 DIAGNOSIS — R0789 Other chest pain: Secondary | ICD-10-CM | POA: Diagnosis not present

## 2019-12-17 DIAGNOSIS — K219 Gastro-esophageal reflux disease without esophagitis: Secondary | ICD-10-CM

## 2019-12-17 NOTE — Patient Instructions (Signed)
Medication Instructions:  Your physician recommends that you continue on your current medications as directed. Please refer to the Current Medication list given to you today.  *If you need a refill on your cardiac medications before your next appointment, please call your pharmacy*  Lab Work: None ordered   If you have labs (blood work) drawn today and your tests are completely normal, you will receive your results only by: Marland Kitchen MyChart Message (if you have MyChart) OR . A paper copy in the mail If you have any lab test that is abnormal or we need to change your treatment, we will call you to review the results.  Testing/Procedures: None ordered   Follow-Up: At [facility], you and your health needs are our priority.  As part of our continuing mission to provide you with exceptional heart care, we have created designated Provider Care Teams.  These Care Teams include your primary Cardiologist (physician) and Advanced Practice Providers (APPs -  Physician Assistants and Nurse Practitioners) who all work together to provide you with the care you need, when you need it.  Your next appointment:   6 month(s)  The format for your next appointment:   In Person  Provider:   You may see Buford Dresser, MD or one of the following Advanced Practice Providers on your designated Care Team:    Rosaria Ferries, PA-C  Jory Sims, DNP, ANP  Cadence Kathlen Mody, NP   Other Instructions None

## 2019-12-17 NOTE — Progress Notes (Signed)
Virtual Visit via Telephone Note   This visit type was conducted due to national recommendations for restrictions regarding the COVID-19 Pandemic (e.g. social distancing) in an effort to limit this patient's exposure and mitigate transmission in our community.  Due to his co-morbid illnesses, this patient is at least at moderate risk for complications without adequate follow up.  This format is felt to be most appropriate for this patient at this time.  The patient did not have access to video technology/had technical difficulties with video requiring transitioning to audio format only (telephone).  All issues noted in this document were discussed and addressed.  No physical exam could be performed with this format.  Please refer to the patient's chart for his  consent to telehealth for [facility].   Date:  12/17/2019   ID:  Anthony Thompson, DOB [birth year], MRN [id number]  Patient Location: Home Provider Location: Office  PCP:  Glenis Smoker, MD  Cardiologist:  Buford Dresser, MD  Electrophysiologist:  None   Evaluation Performed:  Follow-Up Visit  Chief Complaint:  followup  History of Present Illness:    Anthony Thompson is a 48 y.o. male with past medical history of GERD.  He initially presented to the ED on 07/09/2019 with complaint of chest pain, bilateral lower abdominal pain and flank pain for 2 weeks.  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  He had no family history of CAD and he was a non-smoker.  He was ruled out of ACS by enzyme and instructed to follow-up with cardiology service.  He saw Bunnie Domino, NP on 08/02/2019, at which time exercise tolerance test was recommended.  Given the atypical nature of his symptom, a course of Protonix was also prescribed.  POET was obtained on 08/13/2019 which showed no significant ischemic changes and no arrhythmia.   Patient was contacted today via telephone visit.  Since his  last office visit, he has been evaluated by GI service who did endoscopy and noted he had inflammation in his stomach.  He was treated with sucralfate and had a significant improvement in his GI symptoms.  However he says even though his abdominal discomfort has improved, he continued to have intermittent left-sided chest pain.  Chest pain symptom has drastically improved from earlier this year but still present at least 1-2 times per week.  The symptom of the chest discomfort is quite atypical.  He says the symptom does not occur with physical exertion and only is noticeable at rest.  He also notices the left-sided chest discomfort when he is laying on his left side.  He described it as a bubbly sensation in the chest.  The symptom is not exacerbated by deep inspiration, body rotation and palpation.  Given the improvement of his symptom, I do not recommend any further work-up.  If it does recur and increase in frequency, we can consider coronary CT in the future.  The patient does not have symptoms concerning for COVID-19 infection (fever, chills, cough, or new shortness of breath).    Past Medical History:  Diagnosis Date  . GERD (gastroesophageal reflux disease)    Past Surgical History:  Procedure Laterality Date  . CHOLECYSTECTOMY       Current Meds  Medication Sig  . Multiple Vitamin (MULTIVITAMIN) tablet Take 1 tablet by mouth daily.     Allergies:   Patient has no known allergies.   Social History   Tobacco Use  . Smoking  status: Never Smoker  . Smokeless tobacco: Never Used  Substance Use Topics  . Alcohol use: Yes    Comment: occassionally  . Drug use: Not on file     Family Hx: The patient's family history includes Dementia in his mother; Hyperlipidemia in his father.  ROS:   Please see the history of present illness.     All other systems reviewed and are negative.   Prior CV studies:   The following studies were reviewed today:  POET 08/13/2019  There was no  ST segment deviation noted during stress.   Normal exercise stress test at excellent work load Normal hemodynamic response No significant arrhythmias  Labs/Other Tests and Data Reviewed:    EKG:  An ECG dated 08/02/2019 was personally reviewed today and demonstrated:  Normal sinus rhythm without significant ST-T wave changes  Recent Labs: 07/09/2019: Hemoglobin 18.0; Platelets 175 08/13/2019: ALT 40; BUN 16; Creatinine, Ser 0.88; Potassium 4.2; Sodium 139   Recent Lipid Panel Lab Results  Component Value Date/Time   CHOL 183 08/13/2019 09:10 AM   TRIG 91 08/13/2019 09:10 AM   HDL 48 08/13/2019 09:10 AM   CHOLHDL 3.8 08/13/2019 09:10 AM   LDLCALC 117 (H) 08/13/2019 09:10 AM    Wt Readings from Last 3 Encounters:  12/17/19 225 lb (102.1 kg)  08/30/19 230 lb (104.3 kg)  08/02/19 232 lb (105.2 kg)     Objective:    Vital Signs:  Ht 5\' 11"  (1.803 m)   Wt 225 lb (102.1 kg)   BMI 31.38 kg/m    VITAL SIGNS:  reviewed  ASSESSMENT & PLAN:    1. Atypical chest pain: Symptom has greatly improved.  I recommended continue observation of his chest pain is worse when he is laying on the left side and it never occurs with physical exertion.  He did not have any chest pain when he was exercising on the treadmill during the previous exercise tolerance test earlier this year.  There was no EKG changes on the treadmill either.  If the symptom become more frequent, we can consider coronary CT in the future.  2. GERD: His abdominal symptom has significantly improved after endoscopy recently and was placed on sucralfate by GI.  COVID-19 Education: The signs and symptoms of COVID-19 were discussed with the patient and how to seek care for testing (follow up with PCP or arrange E-visit).  The importance of social distancing was discussed today.  Time:   Today, I have spent 14 minutes with the patient with telehealth technology discussing the above problems.     Medication Adjustments/Labs and  Tests Ordered: Current medicines are reviewed at length with the patient today.  Concerns regarding medicines are outlined above.   Tests Ordered: No orders of the defined types were placed in this encounter.   Medication Changes: No orders of the defined types were placed in this encounter.   Follow Up:  In Person in 6 month(s)  Signed, Almyra Deforest, [place]  12/17/2019 1:35 PM    [facility]

## 2020-02-28 DIAGNOSIS — K219 Gastro-esophageal reflux disease without esophagitis: Secondary | ICD-10-CM | POA: Diagnosis not present

## 2020-02-28 DIAGNOSIS — Z1211 Encounter for screening for malignant neoplasm of colon: Secondary | ICD-10-CM | POA: Diagnosis not present

## 2020-02-28 DIAGNOSIS — K2970 Gastritis, unspecified, without bleeding: Secondary | ICD-10-CM | POA: Diagnosis not present

## 2020-03-12 ENCOUNTER — Other Ambulatory Visit: Payer: Self-pay

## 2020-03-12 ENCOUNTER — Emergency Department (HOSPITAL_COMMUNITY): Payer: Federal, State, Local not specified - PPO

## 2020-03-12 ENCOUNTER — Emergency Department (HOSPITAL_COMMUNITY)
Admission: EM | Admit: 2020-03-12 | Discharge: 2020-03-12 | Disposition: A | Discharge status: Home / Self Care | Payer: Federal, State, Local not specified - PPO | Attending: Emergency Medicine | Admitting: Emergency Medicine

## 2020-03-12 DIAGNOSIS — Z23 Encounter for immunization: Secondary | ICD-10-CM | POA: Diagnosis not present

## 2020-03-12 DIAGNOSIS — Y9389 Activity, other specified: Secondary | ICD-10-CM | POA: Insufficient documentation

## 2020-03-12 DIAGNOSIS — S61301A Unspecified open wound of left index finger with damage to nail, initial encounter: Secondary | ICD-10-CM | POA: Diagnosis not present

## 2020-03-12 DIAGNOSIS — Y998 Other external cause status: Secondary | ICD-10-CM | POA: Insufficient documentation

## 2020-03-12 DIAGNOSIS — W231XXA Caught, crushed, jammed, or pinched between stationary objects, initial encounter: Secondary | ICD-10-CM | POA: Insufficient documentation

## 2020-03-12 DIAGNOSIS — S6992XA Unspecified injury of left wrist, hand and finger(s), initial encounter: Secondary | ICD-10-CM | POA: Diagnosis not present

## 2020-03-12 DIAGNOSIS — S61309A Unspecified open wound of unspecified finger with damage to nail, initial encounter: Secondary | ICD-10-CM

## 2020-03-12 DIAGNOSIS — S67191A Crushing injury of left index finger, initial encounter: Secondary | ICD-10-CM | POA: Insufficient documentation

## 2020-03-12 DIAGNOSIS — Y929 Unspecified place or not applicable: Secondary | ICD-10-CM | POA: Diagnosis not present

## 2020-03-12 MED ORDER — CEPHALEXIN 500 MG PO CAPS: 500.0000 mg | ORAL_CAPSULE | Freq: Four times a day (QID) | ORAL | 0 refills | Status: AC

## 2020-03-12 MED ORDER — BUPIVACAINE HCL (PF) 0.5 % IJ SOLN
5.0000 mL | Freq: Once | INTRAMUSCULAR | Status: AC
  Administered 2020-03-12: 5 mL
  Filled 2020-03-12: qty 10

## 2020-03-12 MED ORDER — CEPHALEXIN 250 MG PO CAPS
500.0000 mg | ORAL_CAPSULE | Freq: Once | ORAL | Status: AC
  Administered 2020-03-12: 500 mg via ORAL
  Filled 2020-03-12: qty 2

## 2020-03-12 MED ORDER — TETANUS-DIPHTH-ACELL PERTUSSIS 5-2.5-18.5 LF-MCG/0.5 IM SUSP
0.5000 mL | Freq: Once | INTRAMUSCULAR | Status: AC
  Administered 2020-03-12: 22:00:00 0.5 mL via INTRAMUSCULAR
  Filled 2020-03-12: qty 0.5

## 2020-03-12 MED ORDER — LIDOCAINE HCL (PF) 1 % IJ SOLN
5.0000 mL | Freq: Once | INTRAMUSCULAR | Status: AC
  Administered 2020-03-12: 21:00:00 5 mL
  Filled 2020-03-12: qty 5

## 2020-03-12 MED ORDER — OXYCODONE-ACETAMINOPHEN 5-325 MG PO TABS
1.0000 | ORAL_TABLET | Freq: Once | ORAL | Status: AC
  Administered 2020-03-12: 20:00:00 1 via ORAL
  Filled 2020-03-12: qty 1

## 2020-03-12 MED ORDER — OXYCODONE-ACETAMINOPHEN 5-325 MG PO TABS: 1.0000 | ORAL_TABLET | Freq: Four times a day (QID) | ORAL | 0 refills | Status: DC | PRN

## 2020-03-12 NOTE — ED Triage Notes (Signed)
Pt arrives to ED w/ c/o L index finger injury, pain 8/10. Nail completely removed from finger.

## 2020-03-12 NOTE — Discharge Instructions (Addendum)
Please leave the dressing in place for 48 hours unless you get it wet.  Do not soak or submerge your hand.  Please call the hand doctor to schedule a follow-up appointment.  Your blood pressure was elevated while in the emergency room.  This is most likely due to the stress of injuring your finger, being in the emergency room, and pain however it is important that you get this rechecked in the next month.  Please take Ibuprofen (Advil, motrin) and Tylenol (acetaminophen) to relieve your pain.  You may take up to 600 MG (3 pills) of normal strength ibuprofen every 8 hours as needed.  In between doses of ibuprofen you make take tylenol, up to 1,000 mg (3 normal strength pills or two extra strength pills).  Do not take more than 3,000 mg tylenol in a 24 hour period.  Please check all medication labels as many medications such as pain and cold medications may contain tylenol. Your pain medication has tylenol in it!  Do not drink alcohol while taking these medications.  Do not take other NSAID'S while taking ibuprofen (such as aleve or naproxen).  Please take ibuprofen with food to decrease stomach upset.  Today you received medications that may make you sleepy or impair your ability to make decisions.  For the next 24 hours please do not drive, operate heavy machinery, care for a small child with out another adult present, or perform any activities that may cause harm to you or someone else if you were to fall asleep or be impaired.   You are being prescribed a medication which may make you sleepy. Please follow up of listed precautions for at least 24 hours after taking one dose.  You may have diarrhea from the antibiotics.  It is very important that you continue to take the antibiotics even if you get diarrhea unless a medical professional tells you that you may stop taking them.  If you stop too early the bacteria you are being treated for will become stronger and you may need different, more powerful  antibiotics that have more side effects and worsening diarrhea.  Please stay well hydrated and consider probiotics as they may decrease the severity of your diarrhea.

## 2020-03-12 NOTE — ED Notes (Signed)
Patient verbalizes understanding of discharge instructions. Opportunity for questioning and answers were provided. Armband removed by staff, pt discharged from ED. Pt. ambulatory and discharged home.  

## 2020-03-12 NOTE — ED Provider Notes (Signed)
[facility]PART[facility] Provider Note   CSN: [id number] Arrival date & time: 03/12/20  1919     History Chief Complaint  Patient presents with  . Hand Pain    Anthony Thompson is a 49 y.o. male with a past medical history of GERD who presents today for evaluation of a left index finger injury. Shortly prior to arrival he was moving a goal when he got his left index finger caught.  He is right-hand dominant.  He is unsure when his last tetanus shot was.  He states that he placed the avulsed portion in a bag on ice.  He does not take any blood thinning medications.  His pain is an 8 out of 10.  He denies concern for any other injuries. His pain is made worse with touch and movement.  Does not radiate or move.    HPI     Past Medical History:  Diagnosis Date  . GERD (gastroesophageal reflux disease)     There are no problems to display for this patient.   Past Surgical History:  Procedure Laterality Date  . CHOLECYSTECTOMY         Family History  Problem Relation Age of Onset  . Dementia Mother        Declined after a fall and hip fracture   . Hyperlipidemia Father     Social History   Tobacco Use  . Smoking status: Never Smoker  . Smokeless tobacco: Never Used  Substance Use Topics  . Alcohol use: Yes    Comment: occassionally  . Drug use: Not on file    Home Medications Prior to Admission medications   Medication Sig Start Date End Date Taking? Authorizing Provider  cephALEXin (KEFLEX) 500 MG capsule Take 1 capsule (500 mg total) by mouth 4 (four) times daily for 10 days. 03/12/20 03/22/20  Lorin Glass, PA-C  oxyCODONE-acetaminophen (PERCOCET/ROXICET) 5-325 MG tablet Take 1 tablet by mouth every 6 (six) hours as needed for severe pain. 03/12/20   Lorin Glass, PA-C    Allergies    Patient has no known allergies.  Review of Systems   Review of Systems  Constitutional: Negative for chills and fever.    Respiratory: Negative for cough.   Genitourinary: Negative for dysuria.  Skin: Positive for wound.  Neurological: Negative for weakness and headaches.  All other systems reviewed and are negative.   Physical Exam Updated Vital Signs BP (!) 152/88 (BP Location: Left Arm)   Pulse 86   Temp 98.9 F (37.2 C) (Oral)   Resp 16   SpO2 98%   Physical Exam Vitals and nursing note reviewed.  Constitutional:      General: He is not in acute distress.    Appearance: He is well-developed. He is not diaphoretic.  HENT:     Head: Normocephalic and atraumatic.  Eyes:     General: No scleral icterus.       Right eye: No discharge.        Left eye: No discharge.     Conjunctiva/sclera: Conjunctivae normal.  Cardiovascular:     Rate and Rhythm: Normal rate and regular rhythm.  Pulmonary:     Effort: Pulmonary effort is normal. No respiratory distress.     Breath sounds: No stridor.  Abdominal:     General: There is no distension.  Musculoskeletal:     Cervical back: Normal range of motion.     Comments: Patient has full range of motion of  the left index finger.  Skin:    General: Skin is warm and dry.     Comments: Please see clinical image.  There is a large avulsion of the distal left index finger including the nail, tip of the finger and part of the nail bed.  There is also tissue missing from crush.  There is exposed periosteum present in the wound bed.  Neurological:     Mental Status: He is alert.     Motor: No abnormal muscle tone.     Comments: Sensation intact to distal tip of left index finger.  Psychiatric:        Behavior: Behavior normal.         ED Results / Procedures / Treatments   Labs (all labs ordered are listed, but only abnormal results are displayed) Labs Reviewed - No data to display  EKG None  Radiology DG Finger Index Left  Result Date: 03/12/2020 CLINICAL DATA:  Status post trauma. EXAM: LEFT INDEX FINGER 2+V COMPARISON:  None. FINDINGS:  There is no evidence of fracture or dislocation. There is no evidence of arthropathy or other focal bone abnormality. Soft tissues are unremarkable. IMPRESSION: Negative. Electronically Signed   By: Virgina Norfolk M.D.   On: 03/12/2020 20:38    Procedures Irrigation  Date/Time: 03/13/2020 12:38 AM Performed by: Lorin Glass, PA-C Authorized by: Lorin Glass, PA-C  Consent: Verbal consent obtained. Risks and benefits: risks, benefits and alternatives were discussed (Infection, allergic reaction, adverse reaction, bleeding, pain, intravenous injection retained foreign bodies) Consent given by: patient Preparation: Patient was prepped and draped in the usual sterile fashion. Local anesthesia used: yes Anesthesia method: Please see separate procedure note.  Anesthesia: Local anesthesia used: yes  Sedation: Patient sedated: no  Patient tolerance: patient tolerated the procedure well with no immediate complications Comments: For purposes of bleeding control and inspection prior to determining this would be simple irrigation rather than irrigation as part of a suture repair a finger tourniquet was used.  This also allowed the base of the wound to be viewed in a clean, bloodless field when evaluating for foreign bodies and damage extent.  Archie Endo Block  Date/Time: 03/13/2020 12:40 AM Performed by: Lorin Glass, PA-C Authorized by: Lorin Glass, PA-C   Consent:    Consent obtained:  Verbal   Consent given by:  Patient   Risks discussed:  Allergic reaction, bleeding, infection, intravenous injection, nerve damage, pain, swelling and unsuccessful block   Alternatives discussed:  No treatment and alternative treatment Indications:    Indications:  Pain relief and procedural anesthesia Location:    Body area:  Upper extremity   Upper extremity nerve blocked: Left index finger digital block.   Laterality:  Left Pre-procedure details:    Skin preparation:   2% chlorhexidine   Preparation: Patient was prepped and draped in usual sterile fashion   Skin anesthesia (see MAR for exact dosages):    Skin anesthesia method:  Topical application   Topical anesthesia: cold spray. Procedure details (see MAR for exact dosages):    Block anesthetic: A total of 2 cc of a 50-50 mix of bupivacaine 0.5% and lidocaine 1% both without epi.   Injection procedure:  Anatomic landmarks identified, incremental injection, negative aspiration for blood, introduced needle and anatomic landmarks palpated Post-procedure details:    Dressing:  Sterile dressing   Outcome:  Anesthesia achieved   Patient tolerance of procedure:  Tolerated well, no immediate complications Comments:     Please note  this nerve block was originally performed with anticipation of repair, however after wound exploration and irrigation it became apparent that there was no skin suitable to be sutured over the wound.     (including critical care time)  Medications Ordered in ED Medications  bupivacaine (MARCAINE) 0.5 % injection 5 mL (5 mLs Infiltration Given 03/12/20 2031)  lidocaine (PF) (XYLOCAINE) 1 % injection 5 mL (5 mLs Infiltration Given 03/12/20 2032)  oxyCODONE-acetaminophen (PERCOCET/ROXICET) 5-325 MG per tablet 1 tablet (1 tablet Oral Given 03/12/20 2028)  Tdap (BOOSTRIX) injection 0.5 mL (0.5 mLs Intramuscular Given 03/12/20 2132)  cephALEXin (KEFLEX) capsule 500 mg (500 mg Oral Given 03/12/20 2233)    ED Course  I have reviewed the triage vital signs and the nursing notes.  Pertinent labs & imaging results that were available during my care of the patient were reviewed by me and considered in my medical decision making (see chart for details).    MDM Rules/Calculators/A&P                     Patient is a 49 year old man who presents today for evaluation of a left index finger injury by a crushing mechanism that occurred shortly prior to arrival.  On initial exam concern for nailbed  laceration with bleeding.   For pain management, shortly after arrival, patient was given a digital block after he gave informed verbal consent using lidocaine and bupivacaine, please see procedure note.  Block was successful.  X-rays were obtained without evidence of fracture.  Wound was irrigated, initially had suspected this would require suture repair, however with irrigation Clausing clot removal and became apparent that there was exposed periosteum. I spoke with on-call hand Dr. Claudia Desanctis.  I made him aware of patient's condition, injuries, and specifically the fact that there was exposed periosteum and avulsion type injury.  He recommended irrigation, and wound care without attempting to replace the nailbed.  He recommended patient follow-up with him in the office this week.  Wound was extensively irrigated by myself.  Base of the wound was viewed in a clean, bloodless field without evidence of retained foreign body.  Xeroform was placed along with nonadherent dressing and bulky dressing for protection. Patient's tetanus was updated.  He is given his first dose of Keflex.  He does not have a known MRSA history therefore no apparent indication for MRSA coverage at this time.  His pain was treated in the emergency room with p.o. narcotics in addition to prompt digital block. [place] PMP is consulted and he is given prescription for continued pain medication use at home.  In addition he is given prescription for antibiotics. Wound care instructions were discussed with patient along with return precautions.  Return precautions were discussed with patient who states their understanding.  At the time of discharge patient denied any unaddressed complaints or concerns.  Patient is agreeable for discharge home.  Note: Portions of this report may have been transcribed using voice recognition software. Every effort was made to ensure accuracy; however, inadvertent computerized transcription errors  may be present  Final Clinical Impression(s) / ED Diagnoses Final diagnoses:  Crushing injury of left index finger, initial encounter  Avulsion of fingernail, initial encounter    Rx / DC Orders ED Discharge Orders         Ordered    oxyCODONE-acetaminophen (PERCOCET/ROXICET) 5-325 MG tablet  Every 6 hours PRN     03/12/20 2234    cephALEXin (KEFLEX) 500 MG capsule  4  times daily     03/12/20 2234           Lorin Glass, PA-C 03/13/20 0050    Nat Christen, MD 03/13/20 1450

## 2020-03-16 ENCOUNTER — Encounter: Payer: Self-pay | Admitting: Plastic Surgery

## 2020-03-16 ENCOUNTER — Other Ambulatory Visit: Payer: Self-pay

## 2020-03-16 ENCOUNTER — Ambulatory Visit: Payer: Federal, State, Local not specified - PPO | Admitting: Plastic Surgery

## 2020-03-16 VITALS — BP 150/88 | HR 72 | Temp 98.5°F | Ht 72.0 in | Wt 225.0 lb

## 2020-03-16 DIAGNOSIS — S67191A Crushing injury of left index finger, initial encounter: Secondary | ICD-10-CM | POA: Diagnosis not present

## 2020-03-16 NOTE — Progress Notes (Signed)
Referring Provider Glenis Smoker, MD [place],  [place] [address]   CC: No chief complaint on file. Left index finger injury  Anthony Thompson is an 49 y.o. male.  HPI: Patient presents several days out from injuring his left index finger.  It was crushed beneath the basketball goal.  He was seen in the emergency room and found to have no fracture but an avulsion of the distal sterile sterile matrix of that finger.  No nailbed repair was possible as the amputated portion was lost.  He did have a small area of exposed bone of the of the distal phalanx distally.  He was cleaned out given antibiotics and sent to follow-up with me.  He has not changed the dressing since then but does have some persistent pain in the finger.  No Known Allergies  Outpatient Encounter Medications as of 03/16/2020  Medication Sig  . cephALEXin (KEFLEX) 500 MG capsule Take 1 capsule (500 mg total) by mouth 4 (four) times daily for 10 days.  Marland Kitchen oxyCODONE-acetaminophen (PERCOCET/ROXICET) 5-325 MG tablet Take 1 tablet by mouth every 6 (six) hours as needed for severe pain.   No facility-administered encounter medications on file as of 03/16/2020.     Past Medical History:  Diagnosis Date  . GERD (gastroesophageal reflux disease)     Past Surgical History:  Procedure Laterality Date  . CHOLECYSTECTOMY      Family History  Problem Relation Age of Onset  . Dementia Mother        Declined after a fall and hip fracture   . Hyperlipidemia Father     Social History   Social History Narrative  . Not on file     Review of Systems General: Denies fevers, chills, weight loss CV: Denies chest pain, shortness of breath, palpitations  Physical Exam Vitals with BMI 03/16/2020 03/12/2020 12/17/2019  Height 6\' 0"  - 5\' 11"   Weight 225 lbs - 225 lbs  BMI [id number] - [id number]  Systolic [id number] 0000000 (No Data)  Diastolic 88 88 (No Data)  Pulse 72 86 -    General:  No acute distress,  Alert and  oriented, Non-Toxic, Normal speech and affect Left hand: Fingers are well-perfused with normal cap refill and a palp radial pulse.  Sensation intact throughout.  He has good range of motion.  He has avulsion of the sterile matrix of the left index finger.  This is about the distal half of it.  The sterile matrix proximally along with the eponychial fold is still intact.  There does appear to be a layer of granulation tissue that is formed over the distal phalanx with no obvious exposure of bone at this point.  Assessment/Plan Patient presents with avulsion of the sterile matrix of the left index finger.  We discussed the various options.  I explained that there was no real good substitute for that sterile matrix other than potentially the sterile matrix of the toe which she was not interested in and I agree.  I explained the only 2 real options were continuing to let this heal and granulate and see what happened versus shortening his finger to get it closed.  He wants to let this heal and I also agree with that plan.  I did explain that there may be some unpredictability when it comes to the nail as it grows back out in its adherence to the scarred area.  I will plan to follow him along and see how that turns  out.  In the meantime is good to do dressing changes and follow-up with me in 2 weeks.  Cindra Presume 03/16/2020, 12:31 PM

## 2020-04-05 ENCOUNTER — Ambulatory Visit: Payer: Federal, State, Local not specified - PPO | Admitting: Plastic Surgery

## 2020-05-01 DIAGNOSIS — Z1159 Encounter for screening for other viral diseases: Secondary | ICD-10-CM | POA: Diagnosis not present

## 2020-05-04 DIAGNOSIS — K648 Other hemorrhoids: Secondary | ICD-10-CM | POA: Diagnosis not present

## 2020-05-04 DIAGNOSIS — K635 Polyp of colon: Secondary | ICD-10-CM | POA: Diagnosis not present

## 2020-05-04 DIAGNOSIS — Z1211 Encounter for screening for malignant neoplasm of colon: Secondary | ICD-10-CM | POA: Diagnosis not present

## 2020-05-04 DIAGNOSIS — K5730 Diverticulosis of large intestine without perforation or abscess without bleeding: Secondary | ICD-10-CM | POA: Diagnosis not present

## 2020-07-08 IMAGING — DX DG FINGER INDEX 2+V*L*
3 series · 3 of 3 positions shown · non-contrast
Comparison: None.

CLINICAL DATA: Status post trauma.

EXAM:
LEFT INDEX FINGER 2+V

[finger ap]
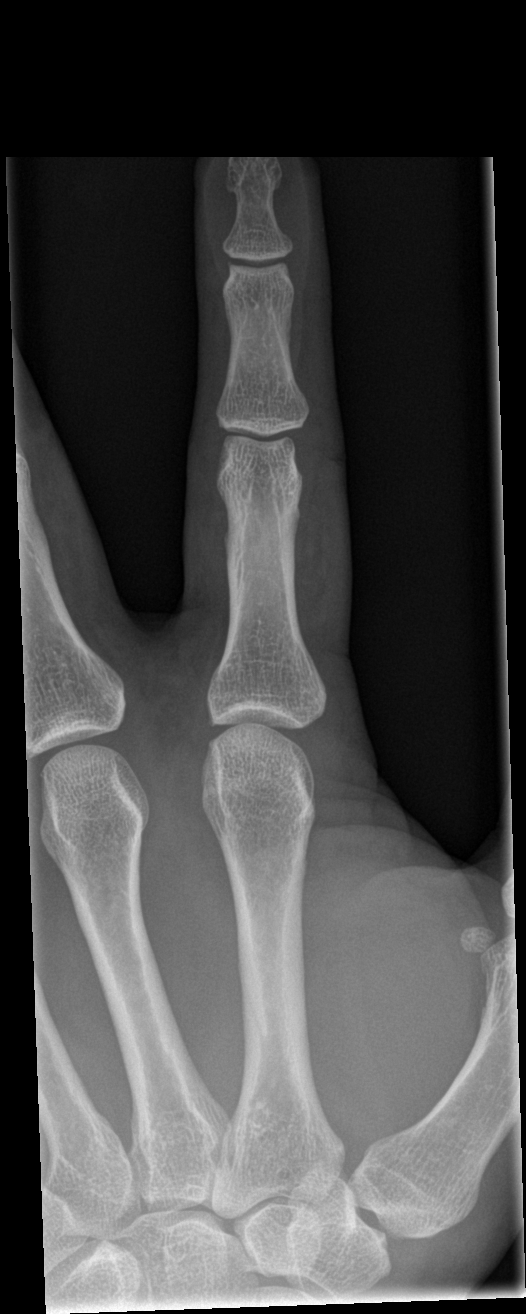

[finger obl]
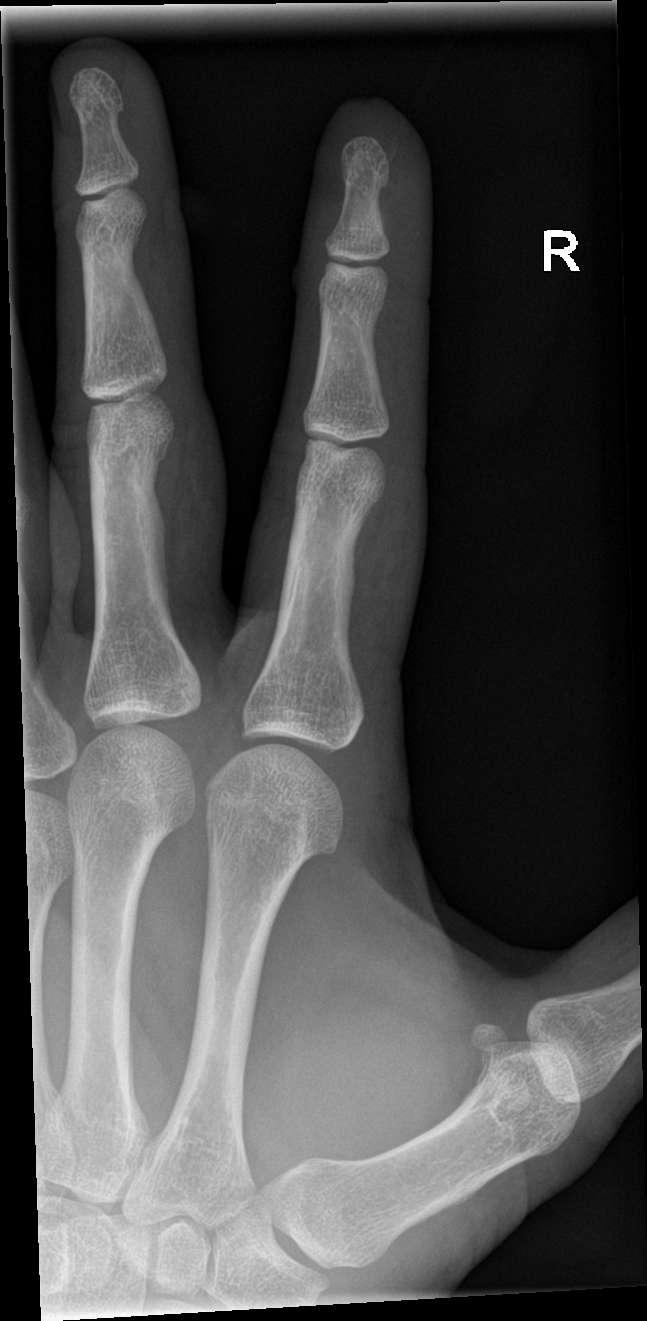

[finger lat]
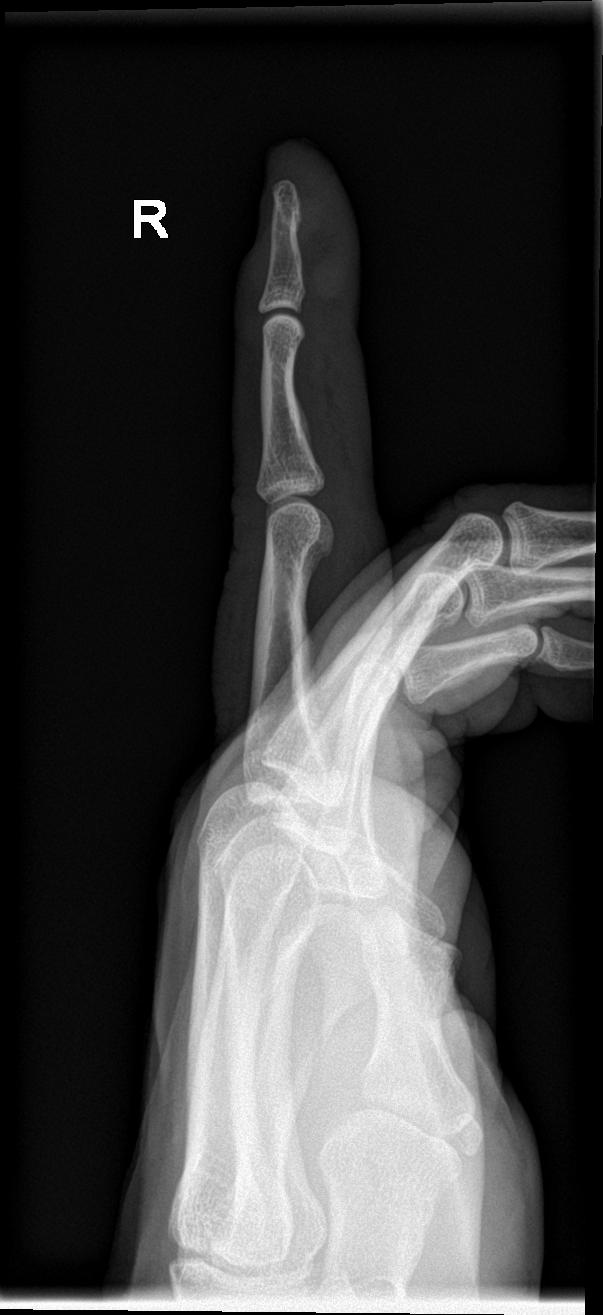

[3 of 3 positions shown; findings below may reference images not displayed]

FINDINGS: There is no evidence of fracture or dislocation. There is no
evidence of arthropathy or other focal bone abnormality. Soft
tissues are unremarkable.
IMPRESSION: Negative.

## 2020-09-07 DIAGNOSIS — Z23 Encounter for immunization: Secondary | ICD-10-CM | POA: Diagnosis not present

## 2020-09-07 DIAGNOSIS — E7800 Pure hypercholesterolemia, unspecified: Secondary | ICD-10-CM | POA: Diagnosis not present

## 2020-09-07 DIAGNOSIS — Z0000 Encounter for general adult medical examination without abnormal findings: Secondary | ICD-10-CM | POA: Diagnosis not present

## 2021-09-24 DIAGNOSIS — E7800 Pure hypercholesterolemia, unspecified: Secondary | ICD-10-CM | POA: Diagnosis not present

## 2021-09-24 DIAGNOSIS — Z23 Encounter for immunization: Secondary | ICD-10-CM | POA: Diagnosis not present

## 2021-09-24 DIAGNOSIS — Z0000 Encounter for general adult medical examination without abnormal findings: Secondary | ICD-10-CM | POA: Diagnosis not present

## 2022-09-30 DIAGNOSIS — R7301 Impaired fasting glucose: Secondary | ICD-10-CM | POA: Diagnosis not present

## 2022-09-30 DIAGNOSIS — Z79899 Other long term (current) drug therapy: Secondary | ICD-10-CM | POA: Diagnosis not present

## 2022-09-30 DIAGNOSIS — Z0000 Encounter for general adult medical examination without abnormal findings: Secondary | ICD-10-CM | POA: Diagnosis not present

## 2022-09-30 DIAGNOSIS — Z23 Encounter for immunization: Secondary | ICD-10-CM | POA: Diagnosis not present

## 2022-09-30 DIAGNOSIS — E7800 Pure hypercholesterolemia, unspecified: Secondary | ICD-10-CM | POA: Diagnosis not present

## 2022-11-11 ENCOUNTER — Ambulatory Visit: Payer: Self-pay

## 2022-11-11 ENCOUNTER — Ambulatory Visit (INDEPENDENT_AMBULATORY_CARE_PROVIDER_SITE_OTHER): Payer: Federal, State, Local not specified - PPO | Admitting: Physician Assistant

## 2022-11-11 ENCOUNTER — Encounter: Payer: Self-pay | Admitting: Physician Assistant

## 2022-11-11 DIAGNOSIS — M25561 Pain in right knee: Secondary | ICD-10-CM

## 2022-11-11 NOTE — Progress Notes (Signed)
Office Visit Note   Patient: Anthony Thompson           Date of Birth: July 28, 1971           MRN: [id number] Visit Date: 11/11/2022              Requested by: Glenis Smoker, MD [address] [place],  [place] [address] PCP: Glenis Smoker, MD  Chief Complaint  Patient presents with   Right Knee - Pain    DOI 11/06/2022      HPI: Anthony Thompson is a pleasant 51 year old gentleman with a 5-day history of right knee pain medially.  He was playing goalie soccer with his children and came down on his knee and demonstrated a valgus type injury.  He does have a small abrasion to the medial side of his knee.  Denies any previous history of knee injuries.  Denies any lower leg pain.  Assessment & Plan: Visit Diagnoses:  1. Acute pain of right knee     Plan: X x-rays are reassuring.  Exam could be consistent with a meniscus contusion medially or perhaps a tear.  He denies any locking or catching.  We will treat this symptomatically with a brace for a couple weeks and reevaluate him at that time continue with icing heat and anti-inflammatories.  If he continued to have painful symptoms would consider an MRI  Follow-Up Instructions: No follow-ups on file.   Ortho Exam  Patient is alert, oriented, no adenopathy, well-dressed, normal affect, normal respiratory effort. Examination of his left knee no effusion no erythema he has a small superficial abrasion over the medial side of the knee joint.  He has good endpoint on Lachman testing good varus valgus stressing without pain.  He does have some pain over the posterior medial joint line no tenderness over the distal femur or tibia  Imaging: No results found. No images are attached to the encounter.  Labs: No results found for: "HGBA1C", "ESRSEDRATE", "CRP", "LABURIC", "REPTSTATUS", "GRAMSTAIN", "CULT", "LABORGA"   Lab Results  Component Value Date   ALBUMIN 4.7 08/13/2019    No results found for: "MG" No results found for:  "VD25OH"  No results found for: "PREALBUMIN"    Latest Ref Rng & Units 07/09/2019   12:17 PM  CBC EXTENDED  WBC 4.0 - 10.5 K/uL 7.3   RBC 4.22 - 5.81 MIL/uL 5.87   Hemoglobin 13.0 - 17.0 g/dL 18.0   HCT 39.0 - 52.0 % 51.4   Platelets 150 - 400 K/uL 175      There is no height or weight on file to calculate BMI.  Orders:  Orders Placed This Encounter  Procedures   XR KNEE 3 VIEW RIGHT   No orders of the defined types were placed in this encounter.    Procedures: No procedures performed  Clinical Data: No additional findings.  ROS:  All other systems negative, except as noted in the HPI. Review of Systems  Objective: Vital Signs: There were no vitals taken for this visit.  Specialty Comments:  No specialty comments available.  PMFS History: There are no problems to display for this patient.  Past Medical History:  Diagnosis Date   GERD (gastroesophageal reflux disease)     Family History  Problem Relation Age of Onset   Dementia Mother        Declined after a fall and hip fracture    Hyperlipidemia Father     Past Surgical History:  Procedure Laterality Date  CHOLECYSTECTOMY     Social History   Occupational History   OccupationHaematologist   Tobacco Use   Smoking status: Never   Smokeless tobacco: Never  Substance and Sexual Activity   Alcohol use: Yes    Comment: occassionally   Drug use: Not on file   Sexual activity: Not on file

## 2022-11-26 ENCOUNTER — Ambulatory Visit: Payer: Federal, State, Local not specified - PPO | Admitting: Orthopaedic Surgery

## 2022-11-26 ENCOUNTER — Encounter: Payer: Self-pay | Admitting: Orthopaedic Surgery

## 2022-11-26 DIAGNOSIS — S83241D Other tear of medial meniscus, current injury, right knee, subsequent encounter: Secondary | ICD-10-CM | POA: Diagnosis not present

## 2022-11-26 DIAGNOSIS — M25561 Pain in right knee: Secondary | ICD-10-CM

## 2022-11-26 DIAGNOSIS — S83241A Other tear of medial meniscus, current injury, right knee, initial encounter: Secondary | ICD-10-CM | POA: Insufficient documentation

## 2022-11-26 NOTE — Progress Notes (Signed)
   Office Visit Note   Patient: Anthony Thompson           Date of Birth: 02/26/1971           MRN: [id number] Visit Date: 11/26/2022              Requested by: Glenis Smoker, MD [place],  [place] [address] PCP: Glenis Smoker, MD   Assessment & Plan: Visit Diagnoses:  1. Acute pain of right knee   2. Acute medial meniscus tear of right knee, subsequent encounter     Plan: Mr. Dorer is now 3 weeks status post acute twisting injury to his right knee.  He still is having swelling catching and mechanical symptoms over the posterior medial joint line.  His x-rays do not demonstrate any arthritic changes.  He has no history of meniscus tears.  Given his continued mechanical symptoms and lack of arthritis in his knee have recommended an MRI of his right knee to rule out a medial meniscus tear.  Should follow-up once this is completed  Follow-Up Instructions: Return After MRI.   Orders:  Orders Placed This Encounter  Procedures   MR Knee Right w/o contrast   No orders of the defined types were placed in this encounter.     Procedures: No procedures performed   Clinical Data: No additional findings.   Subjective: Chief Complaint  Patient presents with   Right Knee - Pain, Follow-up    HPI Mr. Brier is now 3 weeks status post twisting his knee while playing soccer with his kids.  He had x-rays at that time which did not demonstrate any osseous injuries and did not demonstrate any degenerative changes.  He comes back today with continued pain and mechanical symptoms  Review of Systems  All other systems reviewed and are negative.    Objective: Vital Signs: There were no vitals taken for this visit.  Physical Exam Constitutional:      Appearance: Normal appearance.  Pulmonary:     Effort: Pulmonary effort is normal.  Neurological:     General: No focal deficit present.     Mental Status: He is alert.     Ortho Exam Examination  of his right knee demonstrates mild soft tissue swelling no effusion.  He has good range of motion however has pain over the posterior medial joint line with terminal extension and flexion.  He has good varus valgus stability.  Good endpoint on Lachman testing. Specialty Comments:  No specialty comments available.  Imaging: No results found.   PMFS History: Patient Active Problem List   Diagnosis Date Noted   Acute medial meniscus tear of right knee 11/26/2022   Past Medical History:  Diagnosis Date   GERD (gastroesophageal reflux disease)     Family History  Problem Relation Age of Onset   Dementia Mother        Declined after a fall and hip fracture    Hyperlipidemia Father     Past Surgical History:  Procedure Laterality Date   CHOLECYSTECTOMY     Social History   Occupational History   Occupation: English as a second language teacher   Tobacco Use   Smoking status: Never   Smokeless tobacco: Never  Substance and Sexual Activity   Alcohol use: Yes    Comment: occassionally   Drug use: Not on file   Sexual activity: Not on file

## 2022-12-06 ENCOUNTER — Ambulatory Visit
Admission: RE | Admit: 2022-12-06 | Discharge: 2022-12-06 | Disposition: A | Discharge status: Home / Self Care | Payer: Federal, State, Local not specified - PPO | Source: Ambulatory Visit | Attending: Orthopaedic Surgery | Admitting: Orthopaedic Surgery

## 2022-12-06 DIAGNOSIS — M25561 Pain in right knee: Secondary | ICD-10-CM

## 2022-12-06 DIAGNOSIS — R600 Localized edema: Secondary | ICD-10-CM | POA: Diagnosis not present

## 2022-12-10 ENCOUNTER — Ambulatory Visit: Payer: Federal, State, Local not specified - PPO | Admitting: Orthopaedic Surgery

## 2022-12-10 ENCOUNTER — Encounter: Payer: Self-pay | Admitting: Orthopaedic Surgery

## 2022-12-10 DIAGNOSIS — M25561 Pain in right knee: Secondary | ICD-10-CM | POA: Insufficient documentation

## 2022-12-10 HISTORY — DX: Pain in right knee: M25.561

## 2022-12-10 NOTE — Progress Notes (Signed)
Office Visit Note   Patient: Anthony Thompson           Date of Birth: [birth year]           MRN: [id number] Visit Date: 12/10/2022              Requested by: Anthony Smoker, MD [place],  [place] [address] PCP: Anthony Smoker, MD   Assessment & Plan: Visit Diagnoses:  1. Acute pain of right knee     Plan: Anthony Thompson had an MRI scan of his right knee.  He continues to have some pain along the medial aspect of his knee just above the medial compartment.  I was concerned that he might have a meniscal tear or possibly an injury to the MCL.  The MRI scan demonstrated a strain of the proximal MCL.  Both menisci were intact.  ACL and PCL were intact.  Both collateral ligaments were intact.  Little bit of fissuring beneath the patella but no significant chondral injuries.  Long discussion regarding the above.  He definitely is improving.  He does have some tenderness over the medial femoral condyle consistent with the MCL strain but no opening with varus or valgus stress and it definitely feels better.  Long discussion regarding what he can expect over time.  I certainly would expect this to resolve without further problem he just needs to be careful with certain kinds of activities that would cause a valgus stress and we will plan to see him back as needed "all questions were answered  Follow-Up Instructions: Return if symptoms worsen or fail to improve.   Orders:  No orders of the defined types were placed in this encounter.  No orders of the defined types were placed in this encounter.     Procedures: No procedures performed   Clinical Data: No additional findings.   Subjective: Chief Complaint  Patient presents with   Right Knee - Pain, Follow-up  Had an MRI scan of his right knee related to persistent pain along the medial aspect of his knee as previously outlined.  He definitely feels like he is improving  HPI  Review of  Systems   Objective: Vital Signs: There were no vitals taken for this visit.  Physical Exam Constitutional:      Appearance: He is well-developed.  Eyes:     Pupils: Pupils are equal, round, and reactive to light.  Pulmonary:     Effort: Pulmonary effort is normal.  Skin:    General: Skin is warm and dry.  Neurological:     Mental Status: He is alert and oriented to person, place, and time.  Psychiatric:        Behavior: Behavior normal.     Ortho Exam awake alert and oriented x 3.  Comfortable sitting.  Right knee without effusion.  There was some tenderness over the MCL attachment to the medial femoral condyle but no opening with varus or valgus stress.  Negative anterior drawer sign.  No popliteal pain or mass.  No calf pain.  Full extension and flexion actively compared to the left knee.  Walks without a limp  Specialty Comments:  No specialty comments available.  Imaging: No results found.   PMFS History: Patient Active Problem List   Diagnosis Date Noted   Pain in right knee 12/10/2022   Past Medical History:  Diagnosis Date   GERD (gastroesophageal reflux disease)     Family History  Problem Relation Age  of Onset   Dementia Mother        Declined after a fall and hip fracture    Hyperlipidemia Father     Past Surgical History:  Procedure Laterality Date   CHOLECYSTECTOMY     Social History   Occupational History   Occupation: English as a second language teacher   Tobacco Use   Smoking status: Never   Smokeless tobacco: Never  Substance and Sexual Activity   Alcohol use: Yes    Comment: occassionally   Drug use: Not on file   Sexual activity: Not on file     Anthony Balding, MD   Note - This record has been created using [employer].  Chart creation errors have been sought, but may not always  have been located. Such creation errors do not reflect on  the standard of medical care.

## 2023-02-03 DIAGNOSIS — M79672 Pain in left foot: Secondary | ICD-10-CM | POA: Insufficient documentation

## 2023-02-25 ENCOUNTER — Encounter: Payer: Self-pay | Admitting: Podiatry

## 2023-02-25 ENCOUNTER — Ambulatory Visit: Payer: Federal, State, Local not specified - PPO | Admitting: Podiatry

## 2023-02-25 ENCOUNTER — Ambulatory Visit (INDEPENDENT_AMBULATORY_CARE_PROVIDER_SITE_OTHER): Payer: Federal, State, Local not specified - PPO

## 2023-02-25 DIAGNOSIS — M21622 Bunionette of left foot: Secondary | ICD-10-CM | POA: Diagnosis not present

## 2023-02-25 DIAGNOSIS — D2372 Other benign neoplasm of skin of left lower limb, including hip: Secondary | ICD-10-CM

## 2023-02-25 NOTE — Progress Notes (Signed)
  Subjective:  Patient ID: [facility], male    DOB: 08-14-1971,   MRN: [id number]  Chief Complaint  Patient presents with   Foot Pain    Possible tailors bunion -  near pinky toe not constant pain on going since after christmas 2023. Painful when pressure is applied     52 y.o. male presents for concern of pain around his left pinky toe that has been going on since right after christmas. Relates it hurts to walk on the area and has been progressively woresning. Denies any current treatments . Denies any other pedal complaints. Denies n/v/f/c.   Past Medical History:  Diagnosis Date   GERD (gastroesophageal reflux disease)     Objective:  Physical Exam: Vascular: DP/PT pulses 2/4 bilateral. CFT <3 seconds. Normal hair growth on digits. No edema.  Skin. No lacerations or abrasions bilateral feet. Hyperkeratotic cored lesion noted sub fifth metatarsal head on the left with disruption of skin lines.  Musculoskeletal: MMT 5/5 bilateral lower extremities in DF, PF, Inversion and Eversion. Deceased ROM in DF of ankle joint.  Neurological: Sensation intact to light touch.   Assessment:   1. Tailor's bunionette, left   2. Benign neoplasm of skin of left foot      Plan:  Patient was evaluated and treated and all questions answered. -Discussed  tailors bunions and benign skin lesions with patient.  with patient and treatment options.  -Hyperkeratotic tissue was debrided with chisel without incident.  -Applied salycylic acid treatment to area with dressing. Advised to remove bandaging tomorrow.  -Encouraged daily moisturizing -Discussed use of pumice stone -Advised good supportive shoes and inserts -Patient to return to office as needed or sooner if condition worsens.   Lorenda Peck, DPM

## 2023-07-09 ENCOUNTER — Other Ambulatory Visit: Payer: Self-pay

## 2023-07-09 ENCOUNTER — Encounter: Payer: Self-pay | Admitting: Emergency Medicine

## 2023-07-09 ENCOUNTER — Ambulatory Visit
Admission: EM | Admit: 2023-07-09 | Discharge: 2023-07-09 | Disposition: A | Discharge status: Home / Self Care | Payer: Federal, State, Local not specified - PPO | Attending: Family Medicine | Admitting: Family Medicine

## 2023-07-09 DIAGNOSIS — G44209 Tension-type headache, unspecified, not intractable: Secondary | ICD-10-CM

## 2023-07-09 MED ORDER — TIZANIDINE HCL 4 MG PO TABS: 4.0000 mg | ORAL_TABLET | Freq: Four times a day (QID) | ORAL | 0 refills | Status: DC | PRN

## 2023-07-09 MED ORDER — NAPROXEN 375 MG PO TABS: 375.0000 mg | ORAL_TABLET | Freq: Two times a day (BID) | ORAL | 0 refills | Status: AC

## 2023-07-09 MED ORDER — METOCLOPRAMIDE HCL 5 MG/ML IJ SOLN
5.0000 mg | Freq: Once | INTRAMUSCULAR | Status: AC
  Administered 2023-07-09: 5 mg via INTRAMUSCULAR

## 2023-07-09 MED ORDER — KETOROLAC TROMETHAMINE 30 MG/ML IJ SOLN
30.0000 mg | Freq: Once | INTRAMUSCULAR | Status: AC
  Administered 2023-07-09: 30 mg via INTRAMUSCULAR

## 2023-07-09 MED ORDER — DEXAMETHASONE SODIUM PHOSPHATE 10 MG/ML IJ SOLN
10.0000 mg | Freq: Once | INTRAMUSCULAR | Status: AC
  Administered 2023-07-09: 10 mg via INTRAMUSCULAR

## 2023-07-09 NOTE — ED Triage Notes (Signed)
No vision changes, no ear issues

## 2023-07-09 NOTE — Discharge Instructions (Addendum)
You received a headache cocktail today in clinic. Which included a steroid called Decadron, anti-inflammatory called Toradol, and a and nausea medicine use for headaches called Reglan.  I am prescribing you tizanidine you may take 1 tablet every 6 hours as needed for headache pain.  Also prescribing Naprosyn which is an anti-inflammatory indicated for migraine type and tension type headache.  Do not exceed taking Naprosyn more than twice daily.  As discussed if your headaches persist and do not improve with treatment or if you begin to have recurrent headaches follow-up with [facility] neurology for further evaluation of headache pain.

## 2023-07-09 NOTE — ED Provider Notes (Signed)
[facility]    CSN: [id number] Arrival date & time: 07/09/23  0802      History   Chief Complaint Chief Complaint  Patient presents with   Headache    HPI Anthony Thompson is a 52 y.o. male.   HPI Patient with a history of GERD, without any other significant medical history of presents today with a headache present x 3 days. The headache is generalized and patient reports experiencing chills with headache. He has take Advil and reports no relief. Denies any associated URI symptoms.  He also denies any dizziness, weakness, or changes in vision.  Patient has had a visual exam back in February and reports no visual changes.  He denies any new stressors recent lifestyle changes which would contribute to headache.  He reports that headache pain waxes and wanes however he characterized the pain as a shooting pain at times pain is dull is more of a tension-type or squeezing bandlike encircling the top portion of his head.  Is any pressure behind the eyes. He has not experienced any nausea or vomiting with headache.  Past Medical History:  Diagnosis Date   GERD (gastroesophageal reflux disease)     Patient Active Problem List   Diagnosis Date Noted   Pain in right knee 12/10/2022    Past Surgical History:  Procedure Laterality Date   CHOLECYSTECTOMY         Home Medications    Prior to Admission medications   Medication Sig Start Date End Date Taking? Authorizing Provider  tiZANidine (ZANAFLEX) 4 MG tablet Take 1 tablet (4 mg total) by mouth every 6 (six) hours as needed (Headache, tension and or cluster headaches). 07/09/23  Yes Bing Neighbors, NP  oxyCODONE-acetaminophen (PERCOCET/ROXICET) 5-325 MG tablet Take 1 tablet by mouth every 6 (six) hours as needed for severe pain. Patient not taking: Reported on 11/11/2022 03/12/20   Cristina Gong, PA-C    Family History Family History  Problem Relation Age of Onset   Dementia Mother        Declined after  a fall and hip fracture    Hyperlipidemia Father     Social History Social History   Tobacco Use   Smoking status: Never   Smokeless tobacco: Never  Vaping Use   Vaping Use: Never used  Substance Use Topics   Alcohol use: Yes    Comment: occassionally   Drug use: Never     Allergies   Patient has no known allergies.   Review of Systems Review of Systems Pertinent negatives listed in HPI  Physical Exam Triage Vital Signs ED Triage Vitals [07/09/23 0820]  Enc Vitals Group     BP      Pulse      Resp      Temp      Temp src      SpO2      Weight      Height      Head Circumference      Peak Flow      Pain Score 8     Pain Loc      Pain Edu?      Excl. in GC?    No data found.  Updated Vital Signs BP 128/79 (BP Location: Left Arm)   Pulse 80   Temp 98.5 F (36.9 C) (Oral)   Resp 20   SpO2 94%   Visual Acuity Right Eye Distance:   Left Eye Distance:   Bilateral Distance:  Right Eye Near:   Left Eye Near:    Bilateral Near:     Physical Exam Constitutional:      Appearance: He is well-developed.  HENT:     Head: Atraumatic.     Mouth/Throat:     Mouth: Mucous membranes are moist.  Eyes:     Extraocular Movements: Extraocular movements intact.     Pupils: Pupils are equal, round, and reactive to light.  Cardiovascular:     Rate and Rhythm: Normal rate and regular rhythm.  Pulmonary:     Effort: Pulmonary effort is normal.     Breath sounds: Normal breath sounds.  Musculoskeletal:        General: Normal range of motion.     Cervical back: Normal range of motion and neck supple. No rigidity.  Skin:    General: Skin is warm and dry.  Neurological:     Mental Status: He is alert.     GCS: GCS eye subscore is 4. GCS verbal subscore is 5. GCS motor subscore is 6.     Sensory: No sensory deficit.     Motor: No weakness.     Gait: Gait normal.      UC Treatments / Results  Labs (all labs ordered are listed, but only abnormal results  are displayed) Labs Reviewed - No data to display  EKG   Radiology No results found.  Procedures Procedures (including critical care time)  Medications Ordered in UC Medications  ketorolac (TORADOL) 30 MG/ML injection 30 mg (has no administration in time range)  dexamethasone (DECADRON) injection 10 mg (has no administration in time range)  metoCLOPramide (REGLAN) injection 5 mg (has no administration in time range)    Initial Impression / Assessment and Plan / UC Course  I have reviewed the triage vital signs and the nursing notes.  Pertinent labs & imaging results that were available during my care of the patient were reviewed by me and considered in my medical decision making (see chart for details).   Headache x 3 days, based on patient's characterization of headache suspect tension headache as the source of headache.  Blood pressure stable.  Patient not experiencing any focal neurological symptoms or any other associated symptoms such as pressure behind the eyes or nausea or vomiting.  Will treat today as a tension headache however patient given strict precautions to be evaluated at the ER if his headache pain worsens or any neurological symptoms persist.  Patient also advised that given his limited history of recurrent headaches if he begins to have recurrent headaches it would be indicated to follow-up with neurology.  Information given to follow-up with [facility] neurology if headaches become a recurrent problem.  Patient verbalized understanding and agreement with plan.  Headache cocktail given with Toradol 30 mg IM, Reglan 5 mg IM, and Decadron 10 mg IM.  Home treatment with Naprosyn 375 twice daily as needed for headache and Tizanidine 4 mg every 6 hours as needed for headache pain. Final Clinical Impressions(s) / UC Diagnoses   Final diagnoses:  Tension headache     Discharge Instructions      You received a headache cocktail today in clinic. Which included a steroid  called Decadron, anti-inflammatory called Toradol, and a and nausea medicine use for headaches called Reglan.  I am prescribing you tizanidine you may take 1 tablet every 6 hours as needed for headache pain.  Also prescribing Naprosyn which is an anti-inflammatory indicated for migraine type and tension type headache.  Do not exceed taking Naprosyn more than twice daily.  As discussed if your headaches persist and do not improve with treatment or if you begin to have recurrent headaches follow-up with [facility] neurology for further evaluation of headache pain.     ED Prescriptions     Medication Sig Dispense Auth. Provider   tiZANidine (ZANAFLEX) 4 MG tablet Take 1 tablet (4 mg total) by mouth every 6 (six) hours as needed (Headache, tension and or cluster headaches). 30 tablet Bing Neighbors, NP      PDMP not reviewed this encounter.   Bing Neighbors, NP 07/09/23 [phone]

## 2023-07-09 NOTE — ED Triage Notes (Signed)
Headache started Sunday night, head pounding since Sunday night.  Having chills.  Unknown if having fever.  No cough, no congestion.  Has taken advil , no relief  Head hurts all over and this is different than usual

## 2023-08-27 DIAGNOSIS — N529 Male erectile dysfunction, unspecified: Secondary | ICD-10-CM | POA: Diagnosis not present

## 2023-09-18 ENCOUNTER — Ambulatory Visit
Admission: EM | Admit: 2023-09-18 | Discharge: 2023-09-18 | Disposition: A | Discharge status: Home / Self Care | Payer: Federal, State, Local not specified - PPO | Attending: Family Medicine | Admitting: Family Medicine

## 2023-09-18 DIAGNOSIS — J020 Streptococcal pharyngitis: Secondary | ICD-10-CM

## 2023-09-18 LAB — POCT RAPID STREP A (OFFICE): Rapid Strep A Screen: POSITIVE — AB

## 2023-09-18 MED ORDER — AMOXICILLIN 875 MG PO TABS: 875.0000 mg | ORAL_TABLET | Freq: Two times a day (BID) | ORAL | 0 refills | Status: AC

## 2023-09-18 NOTE — ED Provider Notes (Signed)
[facility]    CSN: [id number] Arrival date & time: 09/18/23  0805      History   Chief Complaint Chief Complaint  Patient presents with   Sore Throat    HPI [facility] is a 52 y.o. male.    Sore Throat  Patient is here for sore throat.  His son tested positive for strep at home.  He started with sore throat and congestion.  No fever/body aches.  No cough/sob.        Past Medical History:  Diagnosis Date   GERD (gastroesophageal reflux disease)    Pain in right knee 12/10/2022    Patient Active Problem List   Diagnosis Date Noted   Pain in left foot 02/03/2023    Past Surgical History:  Procedure Laterality Date   CHOLECYSTECTOMY         Home Medications    Prior to Admission medications   Medication Sig Start Date End Date Taking? Authorizing Provider  tadalafil (CIALIS) 10 MG tablet Take 10 mg by mouth daily as needed for erectile dysfunction. 08/29/23  Yes [provider]  naproxen (NAPROSYN) 375 MG tablet Take 1 tablet (375 mg total) by mouth 2 (two) times daily. 07/09/23   Bing Neighbors, NP  tiZANidine (ZANAFLEX) 4 MG tablet Take 1 tablet (4 mg total) by mouth every 6 (six) hours as needed (Headache, tension and or cluster headaches). 07/09/23   Bing Neighbors, NP    Family History Family History  Problem Relation Age of Onset   Dementia Mother        Declined after a fall and hip fracture    Hyperlipidemia Father     Social History Social History   Tobacco Use   Smoking status: Never   Smokeless tobacco: Never  Vaping Use   Vaping status: Never Used  Substance Use Topics   Alcohol use: Yes    Comment: occassionally   Drug use: Never     Allergies   Patient has no known allergies.   Review of Systems Review of Systems  Constitutional: Negative.   HENT:  Positive for rhinorrhea and sore throat.   Respiratory: Negative.    Cardiovascular: Negative.   Gastrointestinal: Negative.    Genitourinary: Negative.   Musculoskeletal: Negative.      Physical Exam Triage Vital Signs ED Triage Vitals  Encounter Vitals Group     BP 09/18/23 0815 119/75     Systolic BP Percentile --      Diastolic BP Percentile --      Pulse Rate 09/18/23 0815 67     Resp 09/18/23 0815 18     Temp 09/18/23 0815 98.1 F (36.7 C)     Temp Source 09/18/23 0815 Oral     SpO2 09/18/23 0815 97 %     Weight 09/18/23 0814 220 lb (99.8 kg)     Height 09/18/23 0814 5\' 11"  (1.803 m)     Head Circumference --      Peak Flow --      Pain Score 09/18/23 0812 0     Pain Loc --      Pain Education --      Exclude from Growth Chart --    No data found.  Updated Vital Signs BP 119/75 (BP Location: Left Arm)   Pulse 67   Temp 98.1 F (36.7 C) (Oral)   Resp 18   Ht 5\' 11"  (1.803 m)   Wt 99.8 kg   SpO2  97%   BMI 30.68 kg/m   Visual Acuity Right Eye Distance:   Left Eye Distance:   Bilateral Distance:    Right Eye Near:   Left Eye Near:    Bilateral Near:     Physical Exam Constitutional:      Appearance: He is well-developed.  HENT:     Nose: Rhinorrhea present.     Mouth/Throat:     Mouth: Mucous membranes are moist.     Pharynx: Posterior oropharyngeal erythema present. No pharyngeal swelling or oropharyngeal exudate.  Cardiovascular:     Rate and Rhythm: Normal rate and regular rhythm.     Heart sounds: Normal heart sounds.  Musculoskeletal:     Cervical back: Normal range of motion.  Neurological:     Mental Status: He is alert.      UC Treatments / Results  Labs (all labs ordered are listed, but only abnormal results are displayed) Labs Reviewed  POCT RAPID STREP A (OFFICE) - Abnormal; Notable for the following components:      Result Value   Rapid Strep A Screen Positive (*)    All other components within normal limits    EKG   Radiology No results found.  Procedures Procedures (including critical care time)  Medications Ordered in UC Medications -  No data to display  Initial Impression / Assessment and Plan / UC Course  I have reviewed the triage vital signs and the nursing notes.  Pertinent labs & imaging results that were available during my care of the patient were reviewed by me and considered in my medical decision making (see chart for details).  Final Clinical Impressions(s) / UC Diagnoses   Final diagnoses:  Streptococcal sore throat     Discharge Instructions      You were seen today for strep throat.   I have sent out amoxicillin to take twice/day x 10 days.   Please get a new toothbrush in several days.  Please change/launder bedding as well.  You may use tylenol and salt water gargles for pain.     ED Prescriptions     Medication Sig Dispense Auth. Provider   amoxicillin (AMOXIL) 875 MG tablet Take 1 tablet (875 mg total) by mouth 2 (two) times daily. 20 tablet Jannifer Franklin, MD      PDMP not reviewed this encounter.   Jannifer Franklin, MD 09/18/23 [phone]

## 2023-09-18 NOTE — Discharge Instructions (Signed)
You were seen today for strep throat.   I have sent out amoxicillin to take twice/day x 10 days.   Please get a new toothbrush in several days.  Please change/launder bedding as well.  You may use tylenol and salt water gargles for pain.

## 2023-09-18 NOTE — ED Triage Notes (Signed)
"  My son has strep throat and I am starting to feel some sore throat and congestion". No fever. Symptoms began "since this past weekend".

## 2023-10-13 DIAGNOSIS — Z79899 Other long term (current) drug therapy: Secondary | ICD-10-CM | POA: Diagnosis not present

## 2023-10-13 DIAGNOSIS — Z0000 Encounter for general adult medical examination without abnormal findings: Secondary | ICD-10-CM | POA: Diagnosis not present

## 2023-10-13 DIAGNOSIS — Z23 Encounter for immunization: Secondary | ICD-10-CM | POA: Diagnosis not present

## 2023-10-13 DIAGNOSIS — E7800 Pure hypercholesterolemia, unspecified: Secondary | ICD-10-CM | POA: Diagnosis not present

## 2023-10-13 DIAGNOSIS — Z125 Encounter for screening for malignant neoplasm of prostate: Secondary | ICD-10-CM | POA: Diagnosis not present

## 2023-10-13 DIAGNOSIS — R7309 Other abnormal glucose: Secondary | ICD-10-CM | POA: Diagnosis not present

## 2024-01-12 ENCOUNTER — Encounter: Payer: Self-pay | Admitting: Podiatry

## 2024-01-12 ENCOUNTER — Ambulatory Visit: Payer: Federal, State, Local not specified - PPO | Admitting: Podiatry

## 2024-01-12 DIAGNOSIS — M899 Disorder of bone, unspecified: Secondary | ICD-10-CM | POA: Diagnosis not present

## 2024-01-12 DIAGNOSIS — M25571 Pain in right ankle and joints of right foot: Secondary | ICD-10-CM

## 2024-01-12 NOTE — Progress Notes (Signed)
  Subjective:  Patient ID: [facility], male    DOB: October 16, 1971,   MRN: [id number]  No chief complaint on file.   53 y.o. male presents for concern of right ankle pain that has been ongoing for a couple most. Relates it has been coming and going. States pinpoint pain right over the medial malleolus. Relates no numbness tingling or burning just gets an aching pain that comes and goes and has noticed the bone in the area getting larger than on the left foot.   . Denies any other pedal complaints. Denies n/v/f/c.   Past Medical History:  Diagnosis Date   GERD (gastroesophageal reflux disease)    Pain in right knee 12/10/2022    Objective:  Physical Exam: Vascular: DP/PT pulses 2/4 bilateral. CFT <3 seconds. Normal hair growth on digits. No edema.  Skin. No lacerations or abrasions bilateral feet.  Musculoskeletal: MMT 5/5 bilateral lower extremities in DF, PF, Inversion and Eversion. Deceased ROM in DF of ankle joint. Mildly tender over medial malleolus. No pain in ankle joint line no pain along PT tendon. No pain with MMT. Mildly more prominent medial malleolus on right than left. No fluctuance or cystic lesions noted.  Neurological: Sensation intact to light touch.   Assessment:   1. Acute right ankle pain   2. Symptomatic bone lesion with abnormal x-ray      Plan:  Patient was evaluated and treated and all questions answered. X-rays reviewed and discussed with patient. No acute fractures or dislocations noted. Difficult to assess but possible lucency noted around medial malleolus.  Discussed potential etiologies including bone cyst/tumor vs tendon irritation vs radiculopathy and treatment options with the patient. Discussed tendon irritation and radiculopathy seem unlikely in area of pain on exam and will subjective findings.  Discussed given pain and recent worsening would like to get MRI to rule out potential tumor of bone.  MRI ordered. Patient to follow-up after MRI results.        Asberry Failing, DPM

## 2024-01-14 DIAGNOSIS — L738 Other specified follicular disorders: Secondary | ICD-10-CM | POA: Diagnosis not present

## 2024-01-14 DIAGNOSIS — L218 Other seborrheic dermatitis: Secondary | ICD-10-CM | POA: Diagnosis not present

## 2024-01-14 DIAGNOSIS — M7701 Medial epicondylitis, right elbow: Secondary | ICD-10-CM | POA: Diagnosis not present

## 2024-01-14 DIAGNOSIS — L918 Other hypertrophic disorders of the skin: Secondary | ICD-10-CM | POA: Diagnosis not present

## 2024-01-26 ENCOUNTER — Ambulatory Visit
Admission: RE | Admit: 2024-01-26 | Discharge: 2024-01-26 | Disposition: A | Discharge status: Home / Self Care | Payer: Federal, State, Local not specified - PPO | Source: Ambulatory Visit | Attending: Podiatry | Admitting: Podiatry

## 2024-01-26 DIAGNOSIS — M76821 Posterior tibial tendinitis, right leg: Secondary | ICD-10-CM | POA: Diagnosis not present

## 2024-01-26 DIAGNOSIS — M899 Disorder of bone, unspecified: Secondary | ICD-10-CM

## 2024-01-26 DIAGNOSIS — M25571 Pain in right ankle and joints of right foot: Secondary | ICD-10-CM | POA: Diagnosis not present

## 2024-04-30 DIAGNOSIS — M7541 Impingement syndrome of right shoulder: Secondary | ICD-10-CM | POA: Diagnosis not present

## 2024-05-28 DIAGNOSIS — M7541 Impingement syndrome of right shoulder: Secondary | ICD-10-CM | POA: Diagnosis not present

## 2024-06-07 DIAGNOSIS — M25511 Pain in right shoulder: Secondary | ICD-10-CM | POA: Diagnosis not present

## 2024-06-09 DIAGNOSIS — S46011A Strain of muscle(s) and tendon(s) of the rotator cuff of right shoulder, initial encounter: Secondary | ICD-10-CM | POA: Diagnosis not present

## 2024-07-28 DIAGNOSIS — K080 Exfoliation of teeth due to systemic causes: Secondary | ICD-10-CM | POA: Diagnosis not present

## 2024-10-29 DIAGNOSIS — E7800 Pure hypercholesterolemia, unspecified: Secondary | ICD-10-CM | POA: Diagnosis not present

## 2024-10-29 DIAGNOSIS — R7309 Other abnormal glucose: Secondary | ICD-10-CM | POA: Diagnosis not present

## 2024-10-29 DIAGNOSIS — Z0000 Encounter for general adult medical examination without abnormal findings: Secondary | ICD-10-CM | POA: Diagnosis not present

## 2024-10-29 DIAGNOSIS — Z125 Encounter for screening for malignant neoplasm of prostate: Secondary | ICD-10-CM | POA: Diagnosis not present

## 2024-10-29 DIAGNOSIS — Z79899 Other long term (current) drug therapy: Secondary | ICD-10-CM | POA: Diagnosis not present

## 2024-10-29 DIAGNOSIS — Z23 Encounter for immunization: Secondary | ICD-10-CM | POA: Diagnosis not present

## 2024-11-01 DIAGNOSIS — Z23 Encounter for immunization: Secondary | ICD-10-CM | POA: Diagnosis not present

## 2024-11-18 DIAGNOSIS — X58XXXA Exposure to other specified factors, initial encounter: Secondary | ICD-10-CM | POA: Diagnosis not present

## 2024-11-18 DIAGNOSIS — G8918 Other acute postprocedural pain: Secondary | ICD-10-CM | POA: Diagnosis not present

## 2024-11-18 DIAGNOSIS — M778 Other enthesopathies, not elsewhere classified: Secondary | ICD-10-CM | POA: Diagnosis not present

## 2024-11-18 DIAGNOSIS — M24211 Disorder of ligament, right shoulder: Secondary | ICD-10-CM | POA: Diagnosis not present

## 2024-11-18 DIAGNOSIS — S43431A Superior glenoid labrum lesion of right shoulder, initial encounter: Secondary | ICD-10-CM | POA: Diagnosis not present

## 2024-11-18 DIAGNOSIS — S43491A Other sprain of right shoulder joint, initial encounter: Secondary | ICD-10-CM | POA: Diagnosis not present

## 2024-11-18 DIAGNOSIS — M24111 Other articular cartilage disorders, right shoulder: Secondary | ICD-10-CM | POA: Diagnosis not present

## 2024-11-18 DIAGNOSIS — M7541 Impingement syndrome of right shoulder: Secondary | ICD-10-CM | POA: Diagnosis not present

## 2024-11-18 DIAGNOSIS — M75121 Complete rotator cuff tear or rupture of right shoulder, not specified as traumatic: Secondary | ICD-10-CM | POA: Diagnosis not present

## 2024-11-18 DIAGNOSIS — Y999 Unspecified external cause status: Secondary | ICD-10-CM | POA: Diagnosis not present

## 2024-12-03 DIAGNOSIS — Z9889 Other specified postprocedural states: Secondary | ICD-10-CM | POA: Diagnosis not present

## 2024-12-03 DIAGNOSIS — M25511 Pain in right shoulder: Secondary | ICD-10-CM | POA: Diagnosis not present
